# Patient Record
Sex: Female | Born: 1980
Health system: Southern US, Community
[De-identification: ages and names within clinical notes are randomized; demographics above are authoritative.]

## PROBLEM LIST (undated history)

## (undated) DIAGNOSIS — J329 Chronic sinusitis, unspecified: Secondary | ICD-10-CM

## (undated) DIAGNOSIS — J342 Deviated nasal septum: Secondary | ICD-10-CM

## (undated) DIAGNOSIS — R05 Cough: Secondary | ICD-10-CM

## (undated) DIAGNOSIS — O24419 Gestational diabetes mellitus in pregnancy, unspecified control: Secondary | ICD-10-CM

## (undated) DIAGNOSIS — J3489 Other specified disorders of nose and nasal sinuses: Secondary | ICD-10-CM

## (undated) DIAGNOSIS — Z8619 Personal history of other infectious and parasitic diseases: Secondary | ICD-10-CM

## (undated) DIAGNOSIS — J45909 Unspecified asthma, uncomplicated: Secondary | ICD-10-CM

## (undated) DIAGNOSIS — T7840XA Allergy, unspecified, initial encounter: Secondary | ICD-10-CM

## (undated) DIAGNOSIS — Q270 Congenital absence and hypoplasia of umbilical artery: Secondary | ICD-10-CM

## (undated) HISTORY — PX: ARTHROSCOPIC REPAIR ACL: SUR80

## (undated) HISTORY — DX: Personal history of other infectious and parasitic diseases: Z86.19

## (undated) HISTORY — DX: Gestational diabetes mellitus in pregnancy, unspecified control: O24.419

## (undated) HISTORY — DX: Congenital absence and hypoplasia of umbilical artery: Q27.0

## (undated) HISTORY — PX: KNEE ARTHROSCOPY: SUR90

## (undated) HISTORY — DX: Unspecified asthma, uncomplicated: J45.909

## (undated) HISTORY — PX: WISDOM TOOTH EXTRACTION: SHX21

## (undated) HISTORY — DX: Allergy, unspecified, initial encounter: T78.40XA

---

## 2006-03-09 ENCOUNTER — Ambulatory Visit (HOSPITAL_BASED_OUTPATIENT_CLINIC_OR_DEPARTMENT_OTHER): Admission: RE | Admit: 2006-03-09 | Discharge: 2006-03-09 | Payer: Self-pay | Admitting: Orthopedic Surgery

## 2011-04-26 ENCOUNTER — Encounter: Payer: Self-pay | Admitting: *Deleted

## 2011-04-26 ENCOUNTER — Emergency Department (HOSPITAL_COMMUNITY)
Admission: EM | Admit: 2011-04-26 | Discharge: 2011-04-26 | Disposition: A | Discharge status: Home / Self Care | Payer: 59 | Source: Home / Self Care | Attending: Emergency Medicine | Admitting: Emergency Medicine

## 2011-04-26 DIAGNOSIS — R05 Cough: Secondary | ICD-10-CM

## 2011-04-26 DIAGNOSIS — R51 Headache: Secondary | ICD-10-CM

## 2011-04-26 MED ORDER — GUAIFENESIN-CODEINE 100-10 MG/5ML PO SYRP: 5.0000 mL | ORAL_SOLUTION | Freq: Three times a day (TID) | ORAL | Status: AC | PRN

## 2011-04-26 NOTE — ED Provider Notes (Addendum)
History     CSN: [id number] Arrival date & time: 04/26/2011  6:24 PM   First MD Initiated Contact with Patient 04/26/11 1716      Chief Complaint  Patient presents with  . Cough    (Consider location/radiation/quality/duration/timing/severity/associated sxs/prior treatment) HPI Comments: COUGHING FOREVER AND SINUS TYPE HEADACHE TOOK BENADRYL, HA GONE NOW" SINCE Saturday THOUGHT HAD A SINUS INFECTION OR SOMETHING" MILD STUFFY NOSE. nO FEVERS. NO FEVERS No body aches  Patient is a 30 y.o. female presenting with cough. The history is provided by the patient. No language interpreter was used.  Cough This is a new problem. The problem occurs constantly. The cough is non-productive. Pertinent negatives include no rhinorrhea.    History reviewed. No pertinent past medical history.  Past Surgical History  Procedure Date  . Knee surgery     Family History  Problem Relation Age of Onset  . Hypertension Mother   . Diabetes Mother     History  Substance Use Topics  . Smoking status: Never Smoker   . Smokeless tobacco: Not on file  . Alcohol Use: Yes     OCCasonal    OB History    Grav Para Term Preterm Abortions TAB SAB Ect Mult Living                  Review of Systems  Constitutional: Negative for fever and appetite change.  HENT: Positive for congestion. Negative for rhinorrhea.   Respiratory: Positive for cough.     Allergies  Keflex; Penicillins; and Vancomycin  Home Medications   Current Outpatient Rx  Name Route Sig Dispense Refill  . ALBUTEROL SULFATE HFA 108 (90 BASE) MCG/ACT IN AERS Inhalation Inhale 2 puffs into the lungs every 6 (six) hours as needed.      Marland Kitchen DIPHENHYDRAMINE HCL 25 MG PO CAPS Oral Take 25 mg by mouth every 6 (six) hours as needed.      Marland Kitchen LORATADINE 10 MG PO TABS Oral Take 10 mg by mouth daily.      . MOMETASONE FUROATE 50 MCG/ACT NA SUSP Nasal Place 2 sprays into the nose daily.      . GUAIFENESIN-CODEINE 100-10 MG/5ML PO SYRP Oral  Take 5 mLs by mouth 3 (three) times daily as needed for cough. 120 mL 0    BP 123/68  Pulse 66  Temp(Src) 98.6 F (37 C) (Oral)  Resp 18  SpO2 100%  LMP 04/13/2011  Physical Exam  Nursing note and vitals reviewed. Constitutional: She appears well-developed and well-nourished. No distress.  HENT:  Head: Normocephalic.  Right Ear: Tympanic membrane normal.  Left Ear: Tympanic membrane normal.  Nose: Nose normal.  Mouth/Throat: Uvula is midline and oropharynx is clear and moist. No oropharyngeal exudate or tonsillar abscesses.    Eyes: Conjunctivae are normal.  Neck: Normal range of motion.  Cardiovascular: Normal rate.   Pulmonary/Chest: Effort normal and breath sounds normal. No respiratory distress. She has no decreased breath sounds. She has no wheezes. She has no rhonchi. She has no rales.  Abdominal: Soft. There is no tenderness. There is no rebound and no guarding.  Musculoskeletal: Normal range of motion.  Neurological: She is alert.  Skin: Skin is warm. She is not diaphoretic. No erythema.    ED Course  Procedures (including critical care time)  Labs Reviewed - No data to display No results found.   1. Headache   2. Cough       MDM  HA-AND COUGH (AFEBRILE NO CONGESTION AFEBRILE  NORMAL EXAM)        Jimmie Molly, MD 04/26/11 1905  Jimmie Molly, MD 04/26/11 [phone]

## 2011-04-26 NOTE — ED Notes (Signed)
Pt  Reports  Symptoms  Of  Cough /  Congested    Sinus  Drainage  Stuffy nose     Symptoms  For  Several  Days       Also  Reports  Headache  As well

## 2011-06-29 ENCOUNTER — Encounter: Payer: Self-pay | Admitting: Internal Medicine

## 2011-06-29 ENCOUNTER — Ambulatory Visit (INDEPENDENT_AMBULATORY_CARE_PROVIDER_SITE_OTHER): Payer: 59 | Admitting: Internal Medicine

## 2011-06-29 DIAGNOSIS — J309 Allergic rhinitis, unspecified: Secondary | ICD-10-CM | POA: Insufficient documentation

## 2011-06-29 DIAGNOSIS — Z0000 Encounter for general adult medical examination without abnormal findings: Secondary | ICD-10-CM | POA: Insufficient documentation

## 2011-06-29 MED ORDER — MONTELUKAST SODIUM 10 MG PO TABS: 10.0000 mg | ORAL_TABLET | Freq: Every day | ORAL | Status: DC

## 2011-06-29 MED ORDER — TRIAMCINOLONE ACETONIDE(NASAL) 55 MCG/ACT NA INHA: 2.0000 | Freq: Every day | NASAL | Status: DC

## 2011-06-29 MED ORDER — MOMETASONE FUROATE 220 MCG/INH IN AEPB: 2.0000 | INHALATION_SPRAY | Freq: Every day | RESPIRATORY_TRACT | Status: DC

## 2011-06-29 NOTE — Assessment & Plan Note (Signed)
Symptoms well controlled on current medications. Will continue. Followup in one year or as needed.

## 2011-06-29 NOTE — Progress Notes (Signed)
Subjective:    Patient ID: Michelle Alvarez, female    DOB: March 06, 1981, 31 y.o.   MRN: [id number]  HPI 31 year old female with history of allergic rhinitis presents to establish care and for general exam. She reports she is doing well. She has no concerns today. She reports her symptoms of runny nose and sneezing are well controlled on her current medications. She follows a healthy diet. She is active and runs several times per week. She is up-to-date on health maintenance including vaccinations and Pap smear. She goes to an OB/GYN for her Paps. She denies any new concerns or complaints today.  Outpatient Encounter Prescriptions as of 06/29/2011  Medication Sig Dispense Refill  . albuterol (PROVENTIL HFA;VENTOLIN HFA) 108 (90 BASE) MCG/ACT inhaler Inhale 2 puffs into the lungs every 6 (six) hours as needed.        . loratadine (CLARITIN) 10 MG tablet Take 10 mg by mouth daily as needed.       . mometasone (ASMANEX) 220 MCG/INH inhaler Inhale 2 puffs into the lungs daily.  3 Inhaler  1  . montelukast (SINGULAIR) 10 MG tablet Take 1 tablet (10 mg total) by mouth at bedtime.  90 tablet  1  . Multiple Vitamins-Minerals (MULTIVITAMIN WITH MINERALS) tablet Take 1 tablet by mouth daily.      Marland Kitchen triamcinolone (NASACORT AQ) 55 MCG/ACT nasal inhaler Place 2 sprays into the nose daily.  3 Inhaler  2     Review of Systems  Constitutional: Negative for fever, chills, appetite change, fatigue and unexpected weight change.  HENT: Negative for ear pain, congestion, sore throat, trouble swallowing, neck pain, voice change and sinus pressure.   Eyes: Negative for visual disturbance.  Respiratory: Negative for cough, shortness of breath, wheezing and stridor.   Cardiovascular: Negative for chest pain, palpitations and leg swelling.  Gastrointestinal: Negative for nausea, vomiting, abdominal pain, diarrhea, constipation, blood in stool, abdominal distention and anal bleeding.  Genitourinary: Negative for dysuria  and flank pain.  Musculoskeletal: Negative for myalgias, arthralgias and gait problem.  Skin: Negative for color change and rash.  Neurological: Negative for dizziness and headaches.  Hematological: Negative for adenopathy. Does not bruise/bleed easily.  Psychiatric/Behavioral: Negative for suicidal ideas, sleep disturbance and dysphoric mood. The patient is not nervous/anxious.    BP 118/68  Pulse 87  Temp(Src) 98 F (36.7 C) (Oral)  Ht 5' 6.5" (1.689 m)  Wt 132 lb (59.875 kg)  BMI 20.99 kg/m2  SpO2 100%  LMP 06/09/2011     Objective:   Physical Exam  Constitutional: She is oriented to person, place, and time. She appears well-developed and well-nourished. No distress.  HENT:  Head: Normocephalic and atraumatic.  Right Ear: External ear normal.  Left Ear: External ear normal.  Nose: Nose normal.  Mouth/Throat: Oropharynx is clear and moist. No oropharyngeal exudate.  Eyes: Conjunctivae are normal. Pupils are equal, round, and reactive to light. Right eye exhibits no discharge. Left eye exhibits no discharge. No scleral icterus.  Neck: Normal range of motion. Neck supple. No tracheal deviation present. No thyromegaly present.  Cardiovascular: Normal rate, regular rhythm, normal heart sounds and intact distal pulses.  Exam reveals no gallop and no friction rub.   No murmur heard. Pulmonary/Chest: Effort normal and breath sounds normal. No respiratory distress. She has no wheezes. She has no rales. She exhibits no tenderness.  Abdominal: Soft. Bowel sounds are normal. She exhibits no distension and no mass. There is no tenderness. There is no rebound and no guarding.  Musculoskeletal: Normal range of motion. She exhibits no edema and no tenderness.  Lymphadenopathy:    She has no cervical adenopathy.  Neurological: She is alert and oriented to person, place, and time. No cranial nerve deficit. She exhibits normal muscle tone. Coordination normal.  Skin: Skin is warm and dry. No  rash noted. She is not diaphoretic. No erythema. No pallor.  Psychiatric: She has a normal mood and affect. Her behavior is normal. Judgment and thought content normal.          Assessment & Plan:

## 2011-06-29 NOTE — Assessment & Plan Note (Signed)
Patient is up-to-date on health maintenance including vaccinations. Exam is normal today. She follows with OB/GYN for her Pap smears. She had labs including lipids through employee health at [facility]. Will obtain her previous records. Encourage continued efforts at healthy diet and exercise. Followup in one year or as needed.

## 2011-08-13 ENCOUNTER — Encounter: Payer: Self-pay | Admitting: Internal Medicine

## 2011-11-16 ENCOUNTER — Telehealth: Payer: Self-pay | Admitting: Internal Medicine

## 2011-11-16 MED ORDER — AZITHROMYCIN 500 MG PO TABS: ORAL_TABLET | ORAL | Status: DC

## 2011-11-16 NOTE — Telephone Encounter (Signed)
Caller: Kjerstin/Patient; PCP: Ronna Polio; CB#: [phone]; Call regarding Congestion; sx started 1st or 2nd week of June; sx include sinus pressure and nasal congestion; no fever; Triaged per URI Guideline; See in 24 hr d/t sx do not improve after 14 days of home care; attempted to make an appt but all appt full for today; OFFICE PLEASE REVIEW AND CALL PT BACK CONCERNING AN APPT

## 2011-11-16 NOTE — Telephone Encounter (Signed)
Patient advised as instructed via telephone, Rx sent to pharmacy, appt scheduled for Friday with Dr. Dan Humphreys.

## 2011-11-16 NOTE — Telephone Encounter (Signed)
Lets add her at 9am on Friday. We can call in Azithromycin 500mg  po today, then 250mg  po days 2-5. She should use sudafed and ibuprofen prn congestion and pain.

## 2011-11-18 ENCOUNTER — Encounter: Payer: Self-pay | Admitting: Internal Medicine

## 2011-11-18 ENCOUNTER — Ambulatory Visit (INDEPENDENT_AMBULATORY_CARE_PROVIDER_SITE_OTHER): Payer: 59 | Admitting: Internal Medicine

## 2011-11-18 VITALS — BP 122/68 | HR 60 | Temp 98.3°F | Resp 18 | Wt 129.5 lb

## 2011-11-18 DIAGNOSIS — B351 Tinea unguium: Secondary | ICD-10-CM

## 2011-11-18 DIAGNOSIS — J019 Acute sinusitis, unspecified: Secondary | ICD-10-CM | POA: Insufficient documentation

## 2011-11-18 NOTE — Assessment & Plan Note (Signed)
Symptoms consistent with acute sinusitis. Started azithromycin (called in by our office Wed because of Holiday) and has had some improvement in symptoms. Will continue to monitor. If no improvement over weekend, would favor changing to Levaquin and adding prednisone. Pt will email with update over the weekend.

## 2011-11-18 NOTE — Assessment & Plan Note (Signed)
Symptoms consistent with onychomycosis of great toenail (based on pictures, toenail covered with polish today). No improvement with topical treatments. Discussed treatment with lamisil and need to check LFTs prior to start and q6week during therapy. Also discussed need for contraception during treatment. Will wait until acute sinus infection improved prior to checking CMP and starting Lamisil.

## 2011-11-18 NOTE — Progress Notes (Signed)
Subjective:    Patient ID: Michelle Alvarez, female    DOB: 1981-03-29, 31 y.o.   MRN: [id number]  HPI 31YO female presents for acute visit complaining of 4 week h/o nasal congestion and 1 week history of headache, sinus pressure, jaw pain and purulent nasal drainage. She brings picture of sinus drainage which is dark green in color.  She denies fever, chills, cough, ear pain. She called our office last week, and because of the holiday, was started on Azithromycin prior to being seen today. Since starting the medication, she notes improvement in sinus pressure and headache.  Her drainage is now clear.  She is also concerned about several month h/o great toenail thickening and cracking. Toenail currently has polish on it, but she brings picture which shows thickening.  She has tried topical listerine with no improvement. She is interested in trying oral medication.  Outpatient Encounter Prescriptions as of 11/18/2011  Medication Sig Dispense Refill  . albuterol (PROVENTIL HFA;VENTOLIN HFA) 108 (90 BASE) MCG/ACT inhaler Inhale 2 puffs into the lungs every 6 (six) hours as needed.        Marland Kitchen azithromycin (ZITHROMAX) 500 MG tablet Take one tablet by mouth today, then one half tablet by mouth days 2-5  3 tablet  0  . loratadine (CLARITIN) 10 MG tablet Take 10 mg by mouth daily as needed.       . mometasone (ASMANEX) 220 MCG/INH inhaler Inhale 2 puffs into the lungs daily.  3 Inhaler  1  . montelukast (SINGULAIR) 10 MG tablet Take 1 tablet (10 mg total) by mouth at bedtime.  90 tablet  1  . Multiple Vitamins-Minerals (MULTIVITAMIN WITH MINERALS) tablet Take 1 tablet by mouth daily.      Marland Kitchen triamcinolone (NASACORT AQ) 55 MCG/ACT nasal inhaler Place 2 sprays into the nose daily.  3 Inhaler  2   BP 122/68  Pulse 60  Temp 98.3 F (36.8 C) (Oral)  Resp 18  Wt 129 lb 8 oz (58.741 kg)  SpO2 97%  LMP 10/18/2011  Review of Systems  Constitutional: Negative for fever, chills and unexpected weight change.    HENT: Positive for congestion and sinus pressure. Negative for hearing loss, ear pain, nosebleeds, sore throat, facial swelling, rhinorrhea, sneezing, mouth sores, trouble swallowing, neck pain, neck stiffness, voice change, postnasal drip, tinnitus and ear discharge.   Eyes: Negative for pain, discharge, redness and visual disturbance.  Respiratory: Negative for cough, chest tightness, shortness of breath, wheezing and stridor.   Cardiovascular: Negative for chest pain, palpitations and leg swelling.  Musculoskeletal: Negative for myalgias and arthralgias.  Skin: Negative for color change and rash.  Neurological: Positive for headaches. Negative for dizziness, weakness and light-headedness.  Hematological: Negative for adenopathy.       Objective:   Physical Exam  Constitutional: She is oriented to person, place, and time. She appears well-developed and well-nourished. No distress.  HENT:  Head: Normocephalic and atraumatic.  Right Ear: External ear normal.  Left Ear: External ear normal.  Nose: Nose normal.  Mouth/Throat: Oropharynx is clear and moist. No oropharyngeal exudate.  Eyes: Conjunctivae are normal. Pupils are equal, round, and reactive to light. Right eye exhibits no discharge. Left eye exhibits no discharge. No scleral icterus.  Neck: Normal range of motion. Neck supple. No tracheal deviation present. No thyromegaly present.  Cardiovascular: Normal rate, regular rhythm, normal heart sounds and intact distal pulses.  Exam reveals no gallop and no friction rub.   No murmur heard. Pulmonary/Chest: Effort normal and  breath sounds normal. No respiratory distress. She has no wheezes. She has no rales. She exhibits no tenderness.  Musculoskeletal: Normal range of motion. She exhibits no edema and no tenderness.  Lymphadenopathy:    She has no cervical adenopathy.  Neurological: She is alert and oriented to person, place, and time. No cranial nerve deficit. She exhibits normal  muscle tone. Coordination normal.  Skin: Skin is warm and dry. No rash noted. She is not diaphoretic. No erythema. No pallor.  Psychiatric: She has a normal mood and affect. Her behavior is normal. Judgment and thought content normal.          Assessment & Plan:

## 2012-01-06 ENCOUNTER — Ambulatory Visit (INDEPENDENT_AMBULATORY_CARE_PROVIDER_SITE_OTHER): Payer: 59 | Admitting: Internal Medicine

## 2012-01-06 ENCOUNTER — Encounter: Payer: Self-pay | Admitting: Internal Medicine

## 2012-01-06 VITALS — BP 110/70 | HR 67 | Temp 98.4°F | Ht 66.5 in | Wt 131.0 lb

## 2012-01-06 DIAGNOSIS — J019 Acute sinusitis, unspecified: Secondary | ICD-10-CM

## 2012-01-06 MED ORDER — PREDNISONE (PAK) 10 MG PO TABS: ORAL_TABLET | ORAL | Status: AC

## 2012-01-06 MED ORDER — LEVOFLOXACIN 750 MG PO TABS: 750.0000 mg | ORAL_TABLET | Freq: Every day | ORAL | Status: AC

## 2012-01-06 NOTE — Progress Notes (Signed)
Subjective:    Patient ID: Michelle Alvarez, female    DOB: 1980-12-29, 31 y.o.   MRN: [id number]  HPI 32 year old female with recent history of sinusitis presents for followup. In July, she was treated for sinusitis with azithromycin. She reports significant improvement in her symptoms however symptoms began to recur approximately one week ago. She reports nasal congestion, purulent nasal drainage, sinus pressure and headache. She denies fever, chills, chest pain, shortness of breath. She has been using over-the-counter allergy medications and ibuprofen with no improvement.  Outpatient Encounter Prescriptions as of 01/06/2012  Medication Sig Dispense Refill  . albuterol (PROVENTIL HFA;VENTOLIN HFA) 108 (90 BASE) MCG/ACT inhaler Inhale 2 puffs into the lungs every 6 (six) hours as needed.        . loratadine (CLARITIN) 10 MG tablet Take 10 mg by mouth daily as needed.       . mometasone (ASMANEX) 220 MCG/INH inhaler Inhale 2 puffs into the lungs daily.  3 Inhaler  1  . montelukast (SINGULAIR) 10 MG tablet Take 1 tablet (10 mg total) by mouth at bedtime.  90 tablet  1  . Multiple Vitamins-Minerals (MULTIVITAMIN WITH MINERALS) tablet Take 1 tablet by mouth daily.      Marland Kitchen triamcinolone (NASACORT AQ) 55 MCG/ACT nasal inhaler Place 2 sprays into the nose daily.  3 Inhaler  2  . levofloxacin (LEVAQUIN) 750 MG tablet Take 1 tablet (750 mg total) by mouth daily.  14 tablet  0  . predniSONE (STERAPRED UNI-PAK) 10 MG tablet Take 60mg  day 1 then taper by 10mg  daily  21 tablet  0  . DISCONTD: azithromycin (ZITHROMAX) 500 MG tablet Take one tablet by mouth today, then one half tablet by mouth days 2-5  3 tablet  0   BP 110/70  Pulse 67  Temp 98.4 F (36.9 C) (Oral)  Ht 5' 6.5" (1.689 m)  Wt 131 lb (59.421 kg)  BMI 20.83 kg/m2  SpO2 97%  LMP 12/23/2011  Review of Systems  Constitutional: Negative for fever, chills and unexpected weight change.  HENT: Positive for congestion and sinus pressure. Negative  for hearing loss, ear pain, nosebleeds, sore throat, facial swelling, rhinorrhea, sneezing, mouth sores, trouble swallowing, neck pain, neck stiffness, voice change, postnasal drip, tinnitus and ear discharge.   Eyes: Negative for pain, discharge, redness and visual disturbance.  Respiratory: Negative for cough, chest tightness, shortness of breath, wheezing and stridor.   Cardiovascular: Negative for chest pain, palpitations and leg swelling.  Musculoskeletal: Negative for myalgias and arthralgias.  Skin: Negative for color change and rash.  Neurological: Positive for headaches. Negative for dizziness, weakness and light-headedness.  Hematological: Negative for adenopathy.       Objective:   Physical Exam  Constitutional: She is oriented to person, place, and time. She appears well-developed and well-nourished. No distress.  HENT:  Head: Normocephalic and atraumatic.  Right Ear: External ear normal.  Left Ear: External ear normal.  Nose: Nose normal.  Mouth/Throat: Oropharynx is clear and moist. No oropharyngeal exudate.  Eyes: Conjunctivae are normal. Pupils are equal, round, and reactive to light. Right eye exhibits no discharge. Left eye exhibits no discharge. No scleral icterus.  Neck: Normal range of motion. Neck supple. No tracheal deviation present. No thyromegaly present.  Cardiovascular: Normal rate, regular rhythm, normal heart sounds and intact distal pulses.  Exam reveals no gallop and no friction rub.   No murmur heard. Pulmonary/Chest: Effort normal and breath sounds normal. No respiratory distress. She has no wheezes. She has no  rales. She exhibits no tenderness.  Musculoskeletal: Normal range of motion. She exhibits no edema and no tenderness.  Lymphadenopathy:    She has no cervical adenopathy.  Neurological: She is alert and oriented to person, place, and time. No cranial nerve deficit. She exhibits normal muscle tone. Coordination normal.  Skin: Skin is warm and dry.  No rash noted. She is not diaphoretic. No erythema. No pallor.  Psychiatric: She has a normal mood and affect. Her behavior is normal. Judgment and thought content normal.          Assessment & Plan:

## 2012-01-06 NOTE — Assessment & Plan Note (Signed)
Symptoms improved and then recurred after treatment with azithromycin. Will treat with Levaquin. If symptoms do not improve over the next 7 days, patient will add prednisone. If no improvement with prednisone and Levaquin, would favor ENT evaluation. Followup one month.

## 2012-03-02 ENCOUNTER — Other Ambulatory Visit: Payer: Self-pay | Admitting: *Deleted

## 2012-03-02 ENCOUNTER — Encounter: Payer: Self-pay | Admitting: Internal Medicine

## 2012-03-02 MED ORDER — MONTELUKAST SODIUM 10 MG PO TABS: 10.0000 mg | ORAL_TABLET | Freq: Every day | ORAL | Status: DC

## 2012-03-02 MED ORDER — ALBUTEROL SULFATE HFA 108 (90 BASE) MCG/ACT IN AERS: 2.0000 | INHALATION_SPRAY | Freq: Four times a day (QID) | RESPIRATORY_TRACT | Status: DC | PRN

## 2012-03-02 NOTE — Telephone Encounter (Signed)
Opened in error

## 2012-03-06 ENCOUNTER — Encounter: Payer: Self-pay | Admitting: Internal Medicine

## 2012-04-15 DIAGNOSIS — J342 Deviated nasal septum: Secondary | ICD-10-CM

## 2012-04-15 DIAGNOSIS — J329 Chronic sinusitis, unspecified: Secondary | ICD-10-CM

## 2012-04-15 HISTORY — DX: Deviated nasal septum: J34.2

## 2012-04-15 HISTORY — DX: Chronic sinusitis, unspecified: J32.9

## 2012-04-19 ENCOUNTER — Other Ambulatory Visit (HOSPITAL_COMMUNITY): Payer: Self-pay | Admitting: Otolaryngology

## 2012-04-19 DIAGNOSIS — J019 Acute sinusitis, unspecified: Secondary | ICD-10-CM

## 2012-04-20 ENCOUNTER — Ambulatory Visit (HOSPITAL_COMMUNITY)
Admission: RE | Admit: 2012-04-20 | Discharge: 2012-04-20 | Disposition: A | Discharge status: Home / Self Care | Payer: 59 | Source: Ambulatory Visit | Attending: Otolaryngology | Admitting: Otolaryngology

## 2012-04-20 DIAGNOSIS — J019 Acute sinusitis, unspecified: Secondary | ICD-10-CM

## 2012-05-02 ENCOUNTER — Other Ambulatory Visit (HOSPITAL_COMMUNITY): Payer: Self-pay | Admitting: Otolaryngology

## 2012-05-02 DIAGNOSIS — J329 Chronic sinusitis, unspecified: Secondary | ICD-10-CM

## 2012-05-03 ENCOUNTER — Encounter: Payer: Self-pay | Admitting: Internal Medicine

## 2012-05-04 MED ORDER — MOMETASONE FUROATE 220 MCG/INH IN AEPB: 2.0000 | INHALATION_SPRAY | Freq: Every day | RESPIRATORY_TRACT | Status: DC

## 2012-05-14 ENCOUNTER — Encounter (HOSPITAL_BASED_OUTPATIENT_CLINIC_OR_DEPARTMENT_OTHER): Payer: Self-pay | Admitting: *Deleted

## 2012-05-14 DIAGNOSIS — J3489 Other specified disorders of nose and nasal sinuses: Secondary | ICD-10-CM

## 2012-05-14 DIAGNOSIS — R059 Cough, unspecified: Secondary | ICD-10-CM

## 2012-05-14 HISTORY — DX: Cough, unspecified: R05.9

## 2012-05-14 HISTORY — DX: Other specified disorders of nose and nasal sinuses: J34.89

## 2012-05-15 ENCOUNTER — Ambulatory Visit (HOSPITAL_COMMUNITY)
Admission: RE | Admit: 2012-05-15 | Discharge: 2012-05-15 | Disposition: A | Discharge status: Home / Self Care | Payer: 59 | Source: Ambulatory Visit | Attending: Otolaryngology | Admitting: Otolaryngology

## 2012-05-15 DIAGNOSIS — Z01818 Encounter for other preprocedural examination: Secondary | ICD-10-CM | POA: Insufficient documentation

## 2012-05-15 DIAGNOSIS — J329 Chronic sinusitis, unspecified: Secondary | ICD-10-CM | POA: Insufficient documentation

## 2012-05-21 ENCOUNTER — Encounter (HOSPITAL_BASED_OUTPATIENT_CLINIC_OR_DEPARTMENT_OTHER): Payer: Self-pay | Admitting: *Deleted

## 2012-05-21 ENCOUNTER — Encounter (HOSPITAL_BASED_OUTPATIENT_CLINIC_OR_DEPARTMENT_OTHER): Payer: Self-pay | Admitting: Anesthesiology

## 2012-05-21 ENCOUNTER — Ambulatory Visit (HOSPITAL_BASED_OUTPATIENT_CLINIC_OR_DEPARTMENT_OTHER)
Admission: RE | Admit: 2012-05-21 | Discharge: 2012-05-21 | Disposition: A | Discharge status: Home / Self Care | Payer: 59 | Source: Ambulatory Visit | Attending: Otolaryngology | Admitting: Otolaryngology

## 2012-05-21 ENCOUNTER — Encounter (HOSPITAL_BASED_OUTPATIENT_CLINIC_OR_DEPARTMENT_OTHER)
Admission: RE | Disposition: A | Discharge status: Home / Self Care | Payer: Self-pay | Source: Ambulatory Visit | Attending: Otolaryngology

## 2012-05-21 ENCOUNTER — Ambulatory Visit (HOSPITAL_BASED_OUTPATIENT_CLINIC_OR_DEPARTMENT_OTHER): Payer: 59 | Admitting: Anesthesiology

## 2012-05-21 DIAGNOSIS — J338 Other polyp of sinus: Secondary | ICD-10-CM | POA: Insufficient documentation

## 2012-05-21 DIAGNOSIS — J343 Hypertrophy of nasal turbinates: Secondary | ICD-10-CM | POA: Insufficient documentation

## 2012-05-21 DIAGNOSIS — J342 Deviated nasal septum: Secondary | ICD-10-CM | POA: Diagnosis present

## 2012-05-21 DIAGNOSIS — J329 Chronic sinusitis, unspecified: Secondary | ICD-10-CM | POA: Diagnosis present

## 2012-05-21 DIAGNOSIS — J328 Other chronic sinusitis: Secondary | ICD-10-CM | POA: Insufficient documentation

## 2012-05-21 HISTORY — PX: NASAL SEPTOPLASTY W/ TURBINOPLASTY: SHX2070

## 2012-05-21 HISTORY — DX: Other specified disorders of nose and nasal sinuses: J34.89

## 2012-05-21 HISTORY — DX: Deviated nasal septum: J34.2

## 2012-05-21 HISTORY — DX: Cough: R05

## 2012-05-21 HISTORY — DX: Chronic sinusitis, unspecified: J32.9

## 2012-05-21 HISTORY — PX: SINUS ENDO W/FUSION: SHX777

## 2012-05-21 SURGERY — SINUS SURGERY, ENDOSCOPIC, USING COMPUTER-ASSISTED NAVIGATION
Anesthesia: General | Laterality: Bilateral | Wound class: Clean Contaminated

## 2012-05-21 MED ORDER — ONDANSETRON HCL 4 MG/2ML IJ SOLN: 4.0000 mg | Freq: Four times a day (QID) | INTRAMUSCULAR | Status: DC | PRN

## 2012-05-21 MED ORDER — OXYCODONE HCL 5 MG PO TABS: 5.0000 mg | ORAL_TABLET | Freq: Once | ORAL | Status: DC | PRN

## 2012-05-21 MED ORDER — HYDROMORPHONE HCL PF 1 MG/ML IJ SOLN: 0.2500 mg | INTRAMUSCULAR | Status: DC | PRN

## 2012-05-21 MED ORDER — LACTATED RINGERS IV SOLN
INTRAVENOUS | Status: DC
  Administered 2012-05-21 (×2): via INTRAVENOUS

## 2012-05-21 MED ORDER — MUPIROCIN 2 % EX OINT
TOPICAL_OINTMENT | CUTANEOUS | Status: DC | PRN
  Administered 2012-05-21: 1 via NASAL

## 2012-05-21 MED ORDER — LIDOCAINE-EPINEPHRINE 1 %-1:100000 IJ SOLN
INTRAMUSCULAR | Status: DC | PRN
  Administered 2012-05-21: 9 mL

## 2012-05-21 MED ORDER — MIDAZOLAM HCL 5 MG/5ML IJ SOLN
INTRAMUSCULAR | Status: DC | PRN
  Administered 2012-05-21: 2 mg via INTRAVENOUS

## 2012-05-21 MED ORDER — ROCURONIUM BROMIDE 100 MG/10ML IV SOLN
INTRAVENOUS | Status: DC | PRN
  Administered 2012-05-21: 30 mg via INTRAVENOUS

## 2012-05-21 MED ORDER — LIDOCAINE HCL (CARDIAC) 20 MG/ML IV SOLN
INTRAVENOUS | Status: DC | PRN
  Administered 2012-05-21: 60 mg via INTRAVENOUS

## 2012-05-21 MED ORDER — OXYMETAZOLINE HCL 0.05 % NA SOLN
NASAL | Status: DC | PRN
  Administered 2012-05-21: 1 via NASAL

## 2012-05-21 MED ORDER — OXYCODONE HCL 5 MG/5ML PO SOLN: 5.0000 mg | Freq: Once | ORAL | Status: DC | PRN

## 2012-05-21 MED ORDER — LEVOFLOXACIN 500 MG PO TABS: 500.0000 mg | ORAL_TABLET | Freq: Every day | ORAL | Status: DC

## 2012-05-21 MED ORDER — DEXAMETHASONE SODIUM PHOSPHATE 4 MG/ML IJ SOLN
INTRAMUSCULAR | Status: DC | PRN
  Administered 2012-05-21: 10 mg via INTRAVENOUS

## 2012-05-21 MED ORDER — PROPOFOL 10 MG/ML IV BOLUS
INTRAVENOUS | Status: DC | PRN
  Administered 2012-05-21: 160 mg via INTRAVENOUS

## 2012-05-21 MED ORDER — NEOSTIGMINE METHYLSULFATE 1 MG/ML IJ SOLN
INTRAMUSCULAR | Status: DC | PRN
  Administered 2012-05-21: 3 mg via INTRAVENOUS

## 2012-05-21 MED ORDER — GLYCOPYRROLATE 0.2 MG/ML IJ SOLN
INTRAMUSCULAR | Status: DC | PRN
  Administered 2012-05-21: 0.2 mg via INTRAVENOUS
  Administered 2012-05-21: 0.4 mg via INTRAVENOUS

## 2012-05-21 MED ORDER — HYDROCODONE-ACETAMINOPHEN 5-325 MG PO TABS: 1.0000 | ORAL_TABLET | Freq: Four times a day (QID) | ORAL | Status: DC | PRN

## 2012-05-21 MED ORDER — ONDANSETRON HCL 4 MG/2ML IJ SOLN
INTRAMUSCULAR | Status: DC | PRN
  Administered 2012-05-21: 4 mg via INTRAVENOUS

## 2012-05-21 MED ORDER — EPHEDRINE SULFATE 50 MG/ML IJ SOLN
INTRAMUSCULAR | Status: DC | PRN
  Administered 2012-05-21: 10 mg via INTRAVENOUS

## 2012-05-21 MED ORDER — CIPROFLOXACIN IN D5W 400 MG/200ML IV SOLN
INTRAVENOUS | Status: DC | PRN
  Administered 2012-05-21: 400 mg via INTRAVENOUS

## 2012-05-21 MED ORDER — FENTANYL CITRATE 0.05 MG/ML IJ SOLN
INTRAMUSCULAR | Status: DC | PRN
  Administered 2012-05-21: 50 ug via INTRAVENOUS
  Administered 2012-05-21: 100 ug via INTRAVENOUS
  Administered 2012-05-21: 50 ug via INTRAVENOUS

## 2012-05-21 SURGICAL SUPPLY — 72 items
ATTRACTOMAT 16X20 MAGNETIC DRP (DRAPES) IMPLANT
BLADE RAD40 ROTATE 4M 4 5PK (BLADE) IMPLANT
BLADE RAD60 ROTATE M4 4 5PK (BLADE) ×2 IMPLANT
BLADE ROTATE RAD 12 4 M4 (BLADE) IMPLANT
BLADE ROTATE RAD 40 4 M4 (BLADE) ×2 IMPLANT
BLADE ROTATE RAD12 5PK M4 4MM (BLADE) IMPLANT
BLADE ROTATE TRICUT 4X13 M4 (BLADE) ×2 IMPLANT
BLADE SURG 15 STRL LF DISP TIS (BLADE) ×1 IMPLANT
BLADE SURG 15 STRL SS (BLADE) ×1
BLADE TRICUT ROTATE M4 4 5PK (BLADE) IMPLANT
BUR HS RAD FRONTAL 3 (BURR) IMPLANT
CANISTER SUC SOCK COL 7 IN (MISCELLANEOUS) ×2 IMPLANT
CANISTER SUCTION 1200CC (MISCELLANEOUS) ×4 IMPLANT
CATH SINUS BALLN 7X16 (CATHETERS) IMPLANT
CATH SINUS BALLN RELIEV 6X16 (SINUPLASTY) IMPLANT
CATH SINUS GUIDE F-70 (CATHETERS) IMPLANT
CATH SINUS GUIDE M/110 (CATHETERS) IMPLANT
CATH SINUS IRRIGATION 2.0 (CATHETERS) IMPLANT
CLOTH BEACON ORANGE TIMEOUT ST (SAFETY) ×2 IMPLANT
COAGULATOR SUCT SWTCH 10FR 6 (ELECTROSURGICAL) ×2 IMPLANT
DECANTER SPIKE VIAL GLASS SM (MISCELLANEOUS) ×2 IMPLANT
DEVICE INFLATION 20/61 (MISCELLANEOUS) IMPLANT
DRAPE SURG 17X23 STRL (DRAPES) ×2 IMPLANT
DRESSING NASAL KENNEDY 3.5X.9 (MISCELLANEOUS) IMPLANT
DRSG NASAL KENNEDY 3.5X.9 (MISCELLANEOUS)
DRSG NASOPORE 8CM (GAUZE/BANDAGES/DRESSINGS) IMPLANT
DRSG TELFA 3X8 NADH (GAUZE/BANDAGES/DRESSINGS) IMPLANT
ELECT COATED BLADE 2.86 ST (ELECTRODE) IMPLANT
ELECT REM PT RETURN 9FT ADLT (ELECTROSURGICAL) ×2
ELECTRODE REM PT RTRN 9FT ADLT (ELECTROSURGICAL) ×1 IMPLANT
GLOVE BIOGEL M 7.0 STRL (GLOVE) ×6 IMPLANT
GLOVE BIOGEL PI IND STRL 7.5 (GLOVE) ×1 IMPLANT
GLOVE BIOGEL PI INDICATOR 7.5 (GLOVE) ×1
GOWN PREVENTION PLUS XLARGE (GOWN DISPOSABLE) ×2 IMPLANT
GOWN PREVENTION PLUS XXLARGE (GOWN DISPOSABLE) ×2 IMPLANT
HANDLE SINUS GUIDE LP (INSTRUMENTS) IMPLANT
HANDPIECE HYDRODEBRIDER FRONT (BLADE) ×2 IMPLANT
IV NS 1000ML (IV SOLUTION)
IV NS 1000ML BAXH (IV SOLUTION) IMPLANT
IV NS 500ML (IV SOLUTION) ×1
IV NS 500ML BAXH (IV SOLUTION) ×1 IMPLANT
NEEDLE 27GAX1X1/2 (NEEDLE) ×2 IMPLANT
NS IRRIG 1000ML POUR BTL (IV SOLUTION) ×2 IMPLANT
PACK BASIN DAY SURGERY FS (CUSTOM PROCEDURE TRAY) ×2 IMPLANT
PACK ENT DAY SURGERY (CUSTOM PROCEDURE TRAY) ×2 IMPLANT
PAD ENT ADHESIVE 25PK (MISCELLANEOUS) ×2 IMPLANT
PENCIL BUTTON HOLSTER BLD 10FT (ELECTRODE) IMPLANT
SET EXT MALE ROTATING LL 32IN (MISCELLANEOUS) ×2 IMPLANT
SLEEVE SCD COMPRESS KNEE MED (MISCELLANEOUS) ×2 IMPLANT
SOLUTION BUTLER CLEAR DIP (MISCELLANEOUS) ×2 IMPLANT
SPLINT NASAL DOYLE BI-VL (GAUZE/BANDAGES/DRESSINGS) ×2 IMPLANT
SPONGE GAUZE 2X2 8PLY STRL LF (GAUZE/BANDAGES/DRESSINGS) ×2 IMPLANT
SPONGE NEURO XRAY DETECT 1X3 (DISPOSABLE) ×2 IMPLANT
SPONGE SURGIFOAM ABS GEL 12-7 (HEMOSTASIS) IMPLANT
SUT ETHILON 3 0 PS 1 (SUTURE) ×2 IMPLANT
SUT PLAIN 4 0 ~~LOC~~ 1 (SUTURE) ×2 IMPLANT
SUT SILK 3 0 PS 1 (SUTURE) IMPLANT
SWAB CULTURE LIQ STUART DBL (MISCELLANEOUS) ×2 IMPLANT
SYR 20CC LL (SYRINGE) ×2 IMPLANT
SYR 3ML 23GX1 SAFETY (SYRINGE) IMPLANT
SYSTEM HYDRODEBRIDER (MISCELLANEOUS) IMPLANT
SYSTEM RELIEVA LUMA ILLUM (SINUPLASTY) IMPLANT
TOWEL OR 17X24 6PK STRL BLUE (TOWEL DISPOSABLE) ×4 IMPLANT
TRACKER ENT INSTRUMENT (MISCELLANEOUS) ×2 IMPLANT
TRACKER ENT PATIENT (MISCELLANEOUS) ×2 IMPLANT
TUBE ANAEROBIC SPECIMEN COL (MISCELLANEOUS) ×2 IMPLANT
TUBE CONNECTING 20X1/4 (TUBING) ×2 IMPLANT
TUBE SALEM SUMP 12R W/ARV (TUBING) IMPLANT
TUBE SALEM SUMP 16 FR W/ARV (TUBING) ×2 IMPLANT
TUBING STRAIGHTSHOT EPS 5PK (TUBING) ×2 IMPLANT
WATER STERILE IRR 1000ML POUR (IV SOLUTION) IMPLANT
YANKAUER SUCT BULB TIP NO VENT (SUCTIONS) ×2 IMPLANT

## 2012-05-21 NOTE — Anesthesia Procedure Notes (Signed)
Procedure Name: Intubation Date/Time: 05/21/2012 8:33 AM Performed by: Zenia Resides D Pre-anesthesia Checklist: Patient identified, Emergency Drugs available, Suction available and Patient being monitored Patient Re-evaluated:Patient Re-evaluated prior to inductionOxygen Delivery Method: Circle System Utilized Preoxygenation: Pre-oxygenation with 100% oxygen Intubation Type: IV induction Ventilation: Mask ventilation without difficulty Laryngoscope Size: Mac and 3 Grade View: Grade I Tube type: Oral Tube size: 7.0 mm Number of attempts: 1 Airway Equipment and Method: stylet and oral airway Placement Confirmation: ETT inserted through vocal cords under direct vision,  positive ETCO2 and breath sounds checked- equal and bilateral Secured at: 22 cm Tube secured with: Tape Dental Injury: Teeth and Oropharynx as per pre-operative assessment

## 2012-05-21 NOTE — H&P (Signed)
Michelle Alvarez is an 32 y.o. female.   Chief Complaint: Chronic sinusitis and septal deviation HPI: Chronic sinusitis, tx'ed for infection over 6 months. CT scan shows chronic infection and obstruction.  Past Medical History  Diagnosis Date  . Deviated nasal septum 04/2012  . Chronic sinusitis 04/2012  . Cough 05/14/2012  . Stuffy and runny nose 05/14/2012    clear drainage from nose    Past Surgical History  Procedure Date  . Wisdom tooth extraction   . Arthroscopic repair acl     bilateral  . Knee arthroscopy     right    Family History  Problem Relation Age of Onset  . Diabetes Father   . Hyperlipidemia Father   . Hypertension Father   . Arthritis Maternal Grandmother   . Breast cancer Maternal Grandmother   . Cancer Maternal Grandmother     melanoma, breast  . Colon cancer Paternal Grandfather   . Cancer Paternal Grandfather     colon  . Arthritis Maternal Grandfather    Social History:  reports that she has never smoked. She has never used smokeless tobacco. She reports that she drinks alcohol. She reports that she does not use illicit drugs.  Allergies:  Allergies  Allergen Reactions  . Cephalexin Rash  . Doxycycline Rash  . Penicillins Rash  . Vancomycin Itching    Medications Prior to Admission  Medication Sig Dispense Refill  . albuterol (PROVENTIL HFA;VENTOLIN HFA) 108 (90 BASE) MCG/ACT inhaler Inhale 2 puffs into the lungs every 6 (six) hours as needed.  1 Inhaler  6  . mometasone (ASMANEX) 220 MCG/INH inhaler Inhale 2 puffs into the lungs daily.  3 Inhaler  1  . mometasone (NASONEX) 50 MCG/ACT nasal spray Place 2 sprays into the nose daily.      . montelukast (SINGULAIR) 10 MG tablet Take 1 tablet (10 mg total) by mouth at bedtime.  90 tablet  1  . predniSONE (STERAPRED UNI-PAK) 10 MG tablet Take 10 mg by mouth daily. PREDNISONE TAPER        No results found for this or any previous visit (from the past 48 hour(s)). No results found.  Review  of Systems  Constitutional: Negative.   Respiratory: Negative.   Cardiovascular: Negative.   Gastrointestinal: Negative.   Musculoskeletal: Negative.   Skin: Negative.   Neurological: Negative.     Blood pressure 130/82, pulse 67, temperature 97.9 F (36.6 C), temperature source Oral, resp. rate 16, height 5\' 6"  (1.676 m), weight 55.43 kg (122 lb 3.2 oz), last menstrual period 05/05/2012, SpO2 100.00%. Physical Exam  Constitutional: She is oriented to person, place, and time. She appears well-developed and well-nourished.  Neck: Normal range of motion. Neck supple.  Cardiovascular: Normal rate and regular rhythm.   Respiratory: Effort normal and breath sounds normal.  GI: Soft.  Musculoskeletal: Normal range of motion.  Neurological: She is alert and oriented to person, place, and time.     Assessment/Plan Adm for OP surgery, septo and bil ESS with Fusion.  Michelle Alvarez 05/21/2012, 8:14 AM

## 2012-05-21 NOTE — Transfer of Care (Signed)
Immediate Anesthesia Transfer of Care Note  Patient: Michelle Alvarez  Procedure(s) Performed: Procedure(s) (LRB) with comments: ENDOSCOPIC SINUS SURGERY WITH FUSION NAVIGATION (Bilateral) - BILATERAL ENDOSCOPIC SINUS SURGERY  WITH FUSION SCAN  NASAL SEPTOPLASTY WITH TURBINATE REDUCTION (Bilateral) - NASAL SEPTOPLATY AND BILATERAL INFERIOR TURBINATE REDUCTION   Patient Location: PACU  Anesthesia Type:General  Level of Consciousness: awake, alert  and oriented  Airway & Oxygen Therapy: Patient Spontanous Breathing and Patient connected to face mask oxygen  Post-op Assessment: Report given to PACU RN and Post -op Vital signs reviewed and stable  Post vital signs: Reviewed and stable  Complications: No apparent anesthesia complications

## 2012-05-21 NOTE — Anesthesia Postprocedure Evaluation (Signed)
Anesthesia Post Note  Patient: Michelle Alvarez  Procedure(s) Performed: Procedure(s) (LRB): ENDOSCOPIC SINUS SURGERY WITH FUSION NAVIGATION (Bilateral) NASAL SEPTOPLASTY WITH TURBINATE REDUCTION (Bilateral)  Anesthesia type: General  Patient location: PACU  Post pain: Pain level controlled and Adequate analgesia  Post assessment: Post-op Vital signs reviewed, Patient's Cardiovascular Status Stable, Respiratory Function Stable, Patent Airway and Pain level controlled  Last Vitals:  Filed Vitals:   05/21/12 1050  BP: 120/51  Pulse: 70  Temp: 37.2 C  Resp: 17    Post vital signs: Reviewed and stable  Level of consciousness: awake, alert  and oriented  Complications: No apparent anesthesia complications

## 2012-05-21 NOTE — Brief Op Note (Signed)
05/21/2012  10:43 AM  PATIENT:  Michelle Alvarez  32 y.o. female  PRE-OPERATIVE DIAGNOSIS:  DEVIATED NASAL SEPTUM; CHRONIC OTITIS MEDIA   POST-OPERATIVE DIAGNOSIS:  DEVIATED NASAL SEPTUM; CHRONIC OTITIS MEDIA   PROCEDURE:  Procedure(s) (LRB) with comments: ENDOSCOPIC SINUS SURGERY WITH FUSION NAVIGATION (Bilateral) - BILATERAL ENDOSCOPIC SINUS SURGERY  WITH FUSION SCAN  NASAL SEPTOPLASTY WITH TURBINATE REDUCTION (Bilateral) - NASAL SEPTOPLATY AND BILATERAL INFERIOR TURBINATE REDUCTION   SURGEON:  Surgeon(s) and Role:    * Osborn Coho, MD - Primary  PHYSICIAN ASSISTANT:   ASSISTANTS: none   ANESTHESIA:   general  EBL:  Total I/O In: 1000 [I.V.:1000] Out: - <200 cc  BLOOD ADMINISTERED:none  DRAINS: none   LOCAL MEDICATIONS USED:  LIDOCAINE  and Amount: 9 ml  SPECIMEN:  Source of Specimen:  sinus contents  DISPOSITION OF SPECIMEN:  PATHOLOGY  COUNTS:  YES  TOURNIQUET:  * No tourniquets in log *  DICTATION: .Other Dictation: Dictation Number [id number]  PLAN OF CARE: Discharge to home after PACU  PATIENT DISPOSITION:  PACU - hemodynamically stable.   Delay start of Pharmacological VTE agent (>24hrs) due to surgical blood loss or risk of bleeding: not applicable

## 2012-05-21 NOTE — Anesthesia Preprocedure Evaluation (Signed)
Anesthesia Evaluation  Patient identified by MRN, date of birth, ID band Patient awake    Reviewed: Allergy & Precautions, H&P , NPO status , Patient's Chart, lab work & pertinent test results  Airway Mallampati: II  Neck ROM: full    Dental   Pulmonary          Cardiovascular     Neuro/Psych    GI/Hepatic   Endo/Other    Renal/GU      Musculoskeletal   Abdominal   Peds  Hematology   Anesthesia Other Findings   Reproductive/Obstetrics                           Anesthesia Physical Anesthesia Plan  ASA: I  Anesthesia Plan: General   Post-op Pain Management:    Induction: Intravenous  Airway Management Planned: Oral ETT  Additional Equipment:   Intra-op Plan:   Post-operative Plan: Extubation in OR  Informed Consent: I have reviewed the patients History and Physical, chart, labs and discussed the procedure including the risks, benefits and alternatives for the proposed anesthesia with the patient or authorized representative who has indicated his/her understanding and acceptance.     Plan Discussed with: CRNA and Surgeon  Anesthesia Plan Comments:         Anesthesia Quick Evaluation  

## 2012-05-22 ENCOUNTER — Encounter (HOSPITAL_BASED_OUTPATIENT_CLINIC_OR_DEPARTMENT_OTHER): Payer: Self-pay | Admitting: Otolaryngology

## 2012-05-22 NOTE — Op Note (Signed)
NAMEEVALYSE, STROOPE            ACCOUNT NO.:  [id number]  MEDICAL RECORD NO.:  [id number]  LOCATION:                                 FACILITY:  PHYSICIAN:  Kinnie Scales. Annalee Genta, M.D.DATE OF BIRTH:  Oct 27, 1980  DATE OF PROCEDURE:  05/21/2012 DATE OF DISCHARGE:                              OPERATIVE REPORT   PREOPERATIVE DIAGNOSES: 1. Chronic pan sinusitis. 2. Deviated nasal septum with airway obstruction. 3. Inferior turbinate hypertrophy.  POSTOPERATIVE DIAGNOSIS: 1. Chronic pan sinusitis. 2. Deviated nasal septum with airway obstruction. 3. Inferior turbinate hypertrophy.  INDICATIONS FOR SURGERY: 1. Chronic pan sinusitis. 2. Deviated nasal septum with airway obstruction. 3. Inferior turbinate hypertrophy.  SURGICAL PROCEDURES: 1. Bilateral endoscopic sinus surgery with intraoperative computer-     assisted navigation (fusion) consisting of bilateral maxillary     antrostomies with removal of diseased tissue, bilateral total     ethmoidectomies, bilateral nasal frontal recess exploration and     left sphenoidotomy with removal of diseased tissue. 2. Nasal septoplasty. 3. Bilateral inferior turbinate reduction.  ANESTHESIA:  General endotracheal.  COMPLICATIONS:  None.  ESTIMATED BLOOD LOSS:  Approximately 200 mL.  CONDITION:  The patient was transferred from the operating room to the recovery room in stable condition.  FINDINGS:  Severely deviated nasal septum with extensive bilateral polypoid sinus disease and thick inspissated mucus secretions in the maxillary, frontal, and sphenoid sinuses.  Bilateral Doyle nasal septal splints placed, no packing.  BRIEF HISTORY:  The patient is a 32 year old white female with a history of chronic respiratory issues and sinusitis.  She has noted a progressive history of nasal airway obstruction, worse over the last three years with increasing issues with allergy and mild asthma.  The patient is a distance runner and has  significant issues with fatigue and difficulty breathing.  Over the last 9 months, the patient has had progressive issues with sinusitis, and in the last 6 months, has been on near continuous antibiotics for chronic sinusitis with symptoms of headache, congestion, discharge, and exacerbation of asthma.  Despite broad-spectrum antibiotic therapy, topical and oral steroids, nasal saline spray and antihistamine, she has continued to have worsening symptoms.  CT scan was performed after antibiotic therapy which showed extensive sinonasal disease involving all sinuses and deviated nasal septum.  Given her history, findings and physical examination with failure to respond to appropriate medical therapy, I recommended the above surgical procedures.  The risks and benefits were discussed in detail with the patient and her family and they understood and concurred with our plan for surgery which was scheduled on an outpatient basis at [facility] on May 21, 2012.  Prior to surgery, a complete CT scan of the sinuses for intraoperative computer-assisted navigation was performed and this was utilized throughout the surgical sinus component of the surgical procedure.  DESCRIPTION OF PROCEDURE:  The patient was brought to the operating room on May 21, 2012, and placed in supine position on the operating table.  General endotracheal anesthesia was established without difficulty.  With the patient adequately anesthetized, she was positioned on the operating table and prepped and draped.  Her nose then injected with a total of 9 mL of 1%  lidocaine 1:100,000 solution epinephrine injected in submucosal fashion along the lateral nasal wall, middle turbinate, nasal septum, and inferior turbinates bilaterally. Her nose was then packed with Afrin-soaked cottonoid pledgets and left in place for approximately 10 minutes for vasoconstriction hemostasis. The Xomed fusion navigation  headgear was applied, and anatomic and surgical landmarks were identified and confirmed.  The patient was prepared for surgery.  I began on the patient's left-hand side.  Endoscopic sinus surgery was undertaken.  Using a 0 degree endoscope, the middle turbinate was carefully medialized.  The uncinate process was reflected anteriorly and resected and a total ethmoidectomy was performed dissecting from anterior to posterior resecting the ethmoid bulla along the floor of the ethmoid sinus.  The left ethmoid sinus had polypoid disease.  No evidence of purulent material or pus.  Switching to the 45 degree telescope and using the curved microdebrider, dissection was carried from posterior to anterior along the roof of the ethmoid sinus.  The nasofrontal recess was identified and again under direct visualization with the endoscope, the nasal frontal recess was widely opened removing underlying the ethmoid cells and diseased mucosa.  Attention was then turned to the lateral nasal wall where residual uncinate process was resected.  The left maxillary sinus had polypoid material within it, which was resected with a curved microdebrider.  The natural ostium of the sinus was enlarged in an inferior and posterior direction creating a widely patent maxillary antrostomy.  Sphenoidotomy was then performed by transecting the posterior aspect of the middle turbinate, identifying the superior turbinate and dissecting from anterior to posterior.  The natural ostium of the sphenoid sinus was identified.  This completely occluded with polypoid disease.  A posterior sphenoidotomy was then performed under direct visualization with navigation and a straight microdebrider.  This connected directly to the natural sphenoid ostium and polypoid mucosa was resected from within the sinus.  There was thick mucoid material but no evidence of infection.  Attention was then turned to the nasal septum were septoplasty was  performed.  The left anterior hemitransfixion incision was created.  The mucoperichondrial flap elevated from anterior to posterior on the left-hand side.  Bony cartilaginous junction was crossed and the midseptal cartilage was removed, later morselized and returned to the mucoperichondrial pocket. A large bony septal spur along the inferior aspect of the right nasal passageway and superiorly on the right was also resected creating good midline nasal septum.  The cartilage was returned to the mucoperichondrial pocket and the flaps were reapproximated with a 4-0 gut suture on a Keith needle in a horizontal mattress fashion.  At the conclusion of the surgical procedure, bilateral Doyle nasal septal splints were placed after the application of Bactroban ointment and sutured into position with a 3-0 Ethilon suture.  Attention was then turned to the patient's right-hand side.  I again began with a 0 degree endoscope.  The middle turbinate was carefully medialized.  Uncinate process was resected.  Ethmoid bulla was transected from anterior to posterior along the floor of the ethmoid sinus.  The posterior ethmoid cells were identified.  There was no evidence of sphenoid disease on the right side and a sphenoidotomy was not performed.  Dissection was then carried from posterior to anterior along the roof of the ethmoid sinus using a 45-degree telescope and a curved microdebrider with navigation.  The nasal frontal recess was completely occluded with underlying ethmoid air cells and polypoid disease.  Again, using a 40 and 60 degree microdebrider, the natural ostium of  the frontal sinus was enlarged.  Polypoid disease was resected and at the conclusion of the procedure widely patent.  Attention was then turned to the lateral nasal wall.  Using a 45-degree telescope and a curved microdebrider, polypoid disease was resected from the natural ostium of the sinus.  There was a posterior accessory  ostium and the intervening soft tissue was resected.  Within the sinus itself, there was heavy polypoid disease which was resected with a curved microdebrider.  Attention was then turned to the inferior turbinates where turbinate reduction was performed.  Bipolar cautery set at 12 watts was used.  Two submucosal passes were made in each inferior turbinate.  When the turbinates had been adequately cauterized, anterior incisions were created, overlying mucosa elevated and a small amount of turbinate bone was resected.  The turbinates were then outfractured to create a more patent nasal cavity.  The patient's nasal passageway and sinuses were carefully inspected. Surgical debris was cleared.  Sinuses were irrigated.  There was no active bleeding, and the sinuses were widely patent.  No packing was placed.  The patient's nasal cavity and nasopharynx were irrigated. Oral cavity was suctioned.  Orogastric tube passed.  Stomach contents were aspirated.  The patient was then awakened from anesthetic.  She was extubated and transferred from the operating room to the recovery room in stable condition.  There were no complications and the estimated blood loss was less than 200 mL.          ______________________________ Kinnie Scales. Annalee Genta, M.D.     DLS/MEDQ  D:  24/40/1027  T:  05/22/2012  Job:  [id number]

## 2012-06-30 ENCOUNTER — Other Ambulatory Visit: Payer: Self-pay

## 2012-12-06 ENCOUNTER — Encounter: Payer: Self-pay | Admitting: Internal Medicine

## 2012-12-06 MED ORDER — MOMETASONE FUROATE 220 MCG/INH IN AEPB: 2.0000 | INHALATION_SPRAY | Freq: Every day | RESPIRATORY_TRACT | Status: DC

## 2013-03-21 ENCOUNTER — Other Ambulatory Visit: Payer: Self-pay

## 2013-03-25 ENCOUNTER — Encounter: Payer: Self-pay | Admitting: Internal Medicine

## 2013-05-20 IMAGING — CT CT PARANASAL SINUSES LIMITED
1 series · 16 of 30 positions shown, 20 images · non-contrast
Comparison: None

CLINICAL DATA: Acute sinusitis.

CT PARANASAL SINUS LIMITED WITHOUT CONTRAST
TECHNIQUE: Multidetector CT images of the paranasal sinuses were
obtained in a single plane without contrast.

[Series 3: ltd. sinus-pron 2.4 h41s · axial · 0.41mm/px · z∈[+593,+728]mm · 16 of 60 slices shown, 20 images]
[im 3/60  brain]
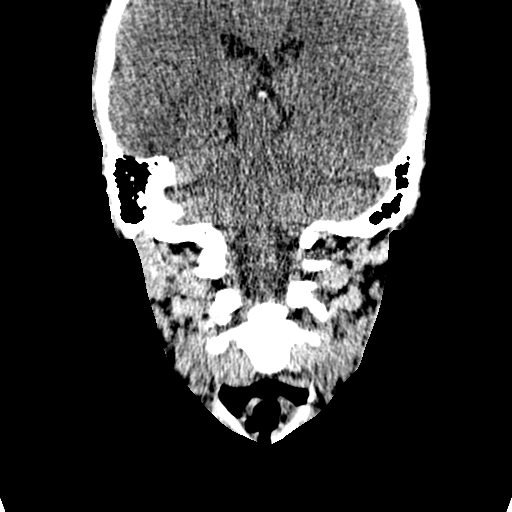
[im 3/60  bone]
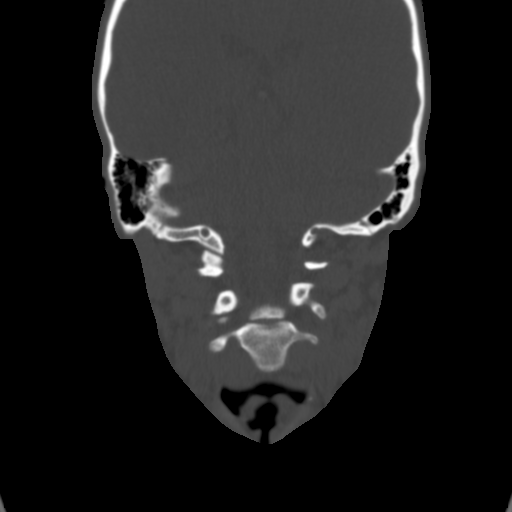
[im 7/60  bone]
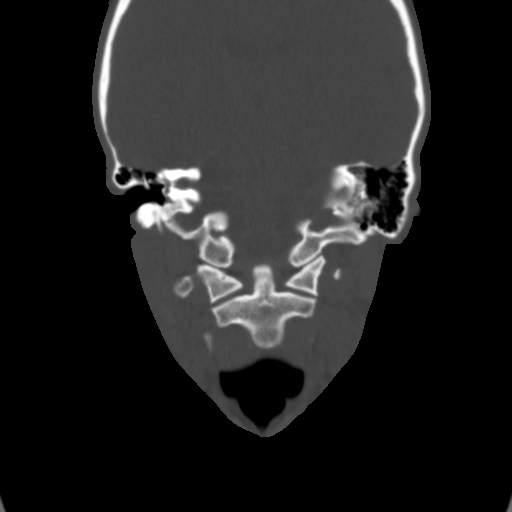
[im 11/60  bone]
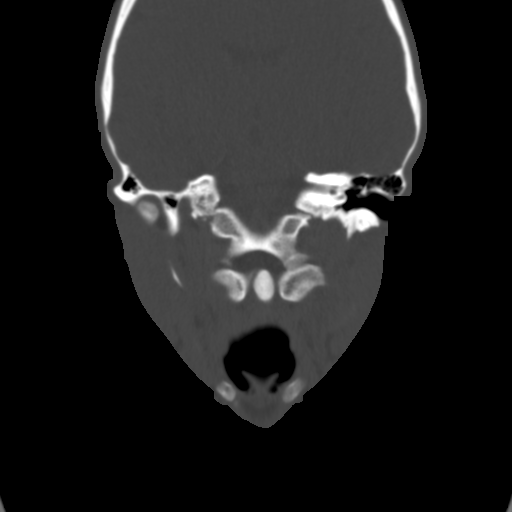
[im 15/60  bone]
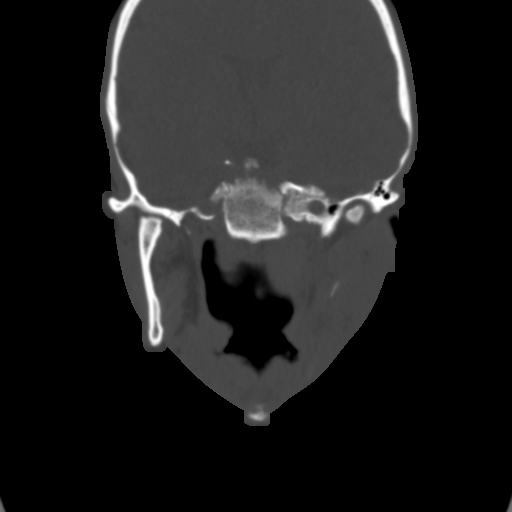
[im 17/60  brain]
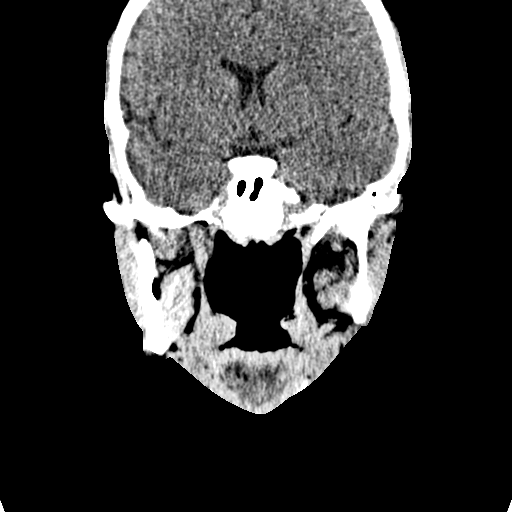
[im 17/60  bone]
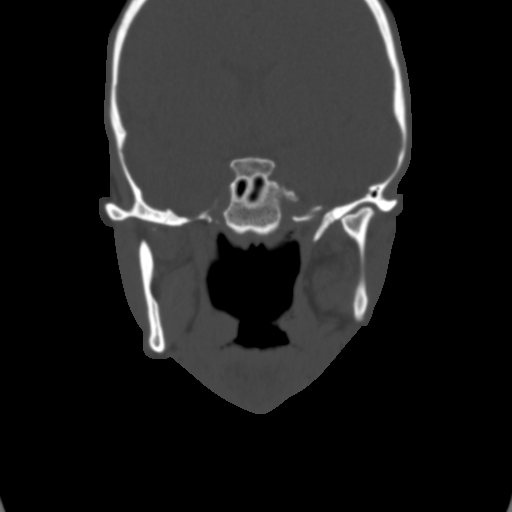
[im 21/60  bone]
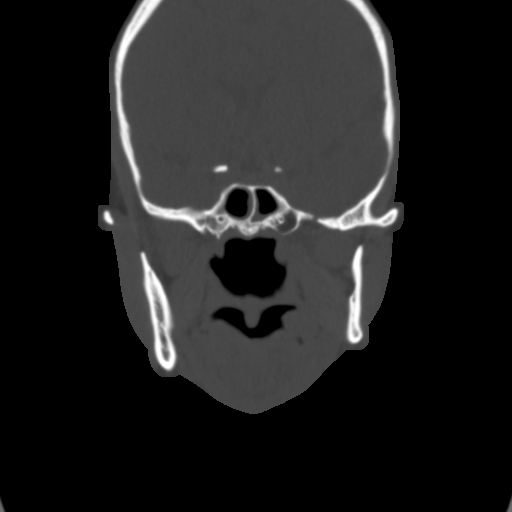
[im 25/60  bone]
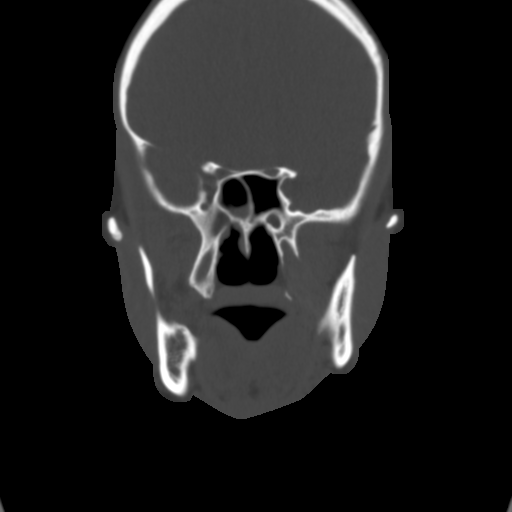
[im 29/60  bone]
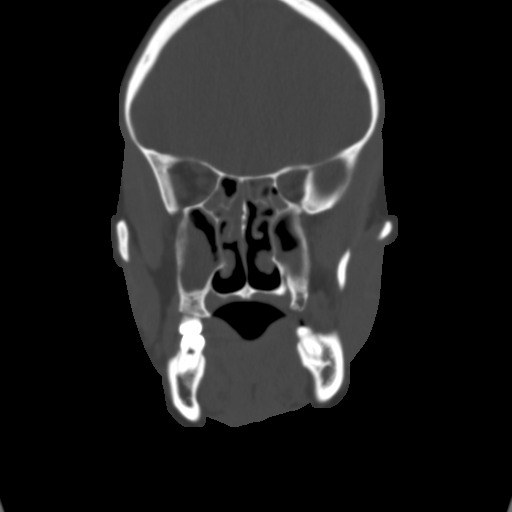
[im 31/60  brain]
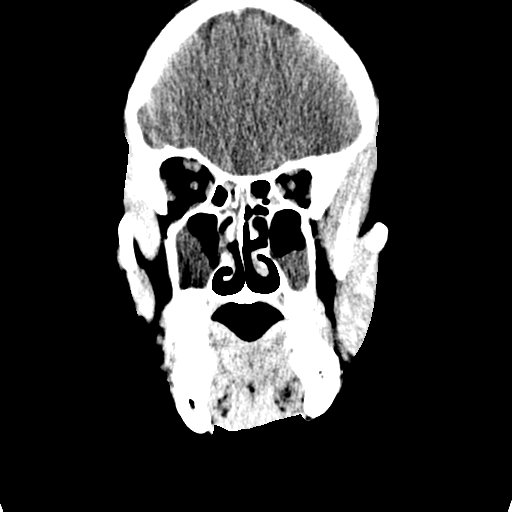
[im 31/60  bone]
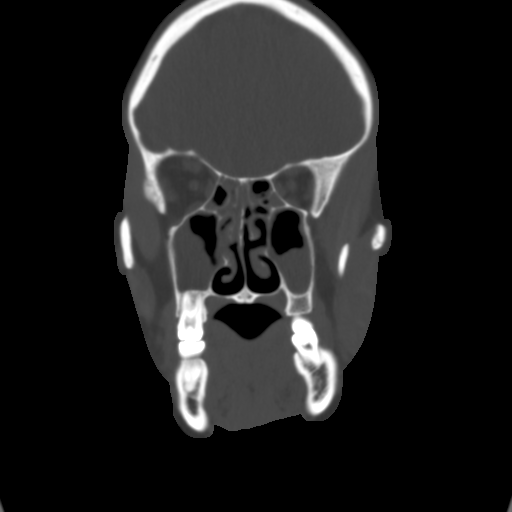
[im 35/60  bone]
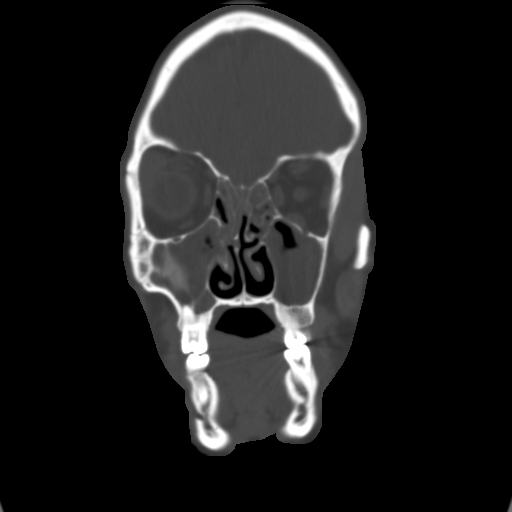
[im 39/60  bone]
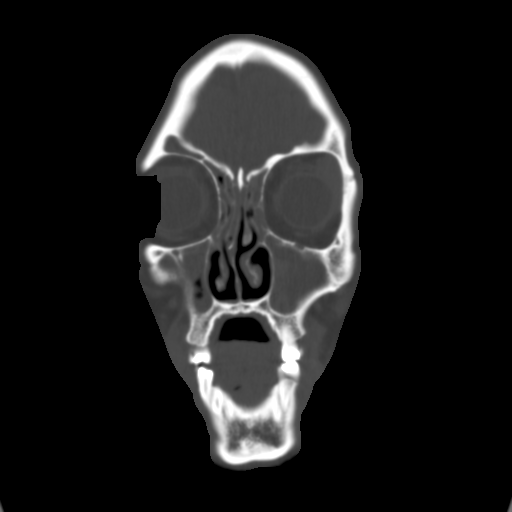
[im 43/60  bone]
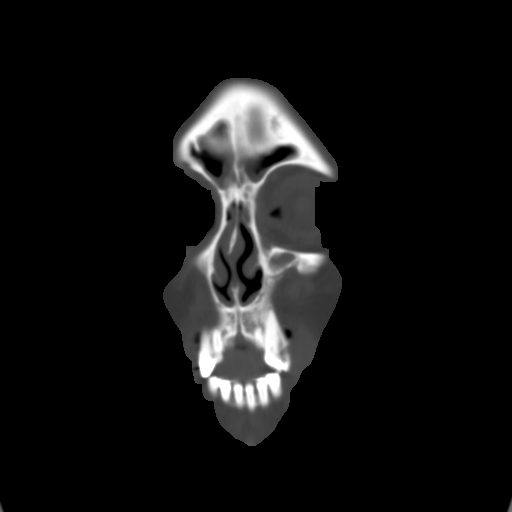
[im 45/60  brain]
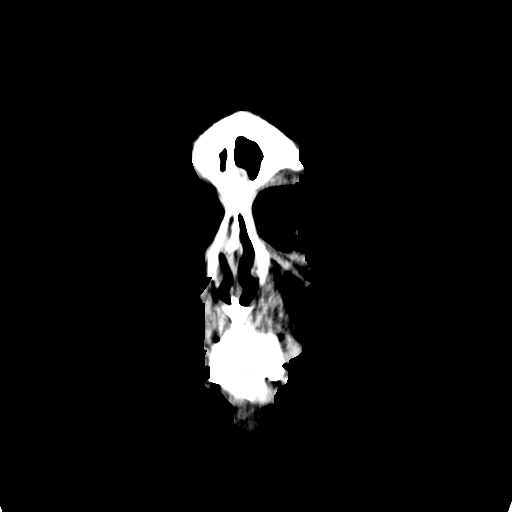
[im 45/60  bone]
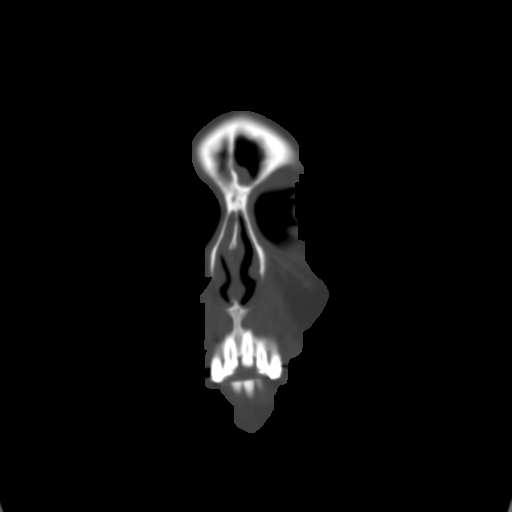
[im 49/60  bone]
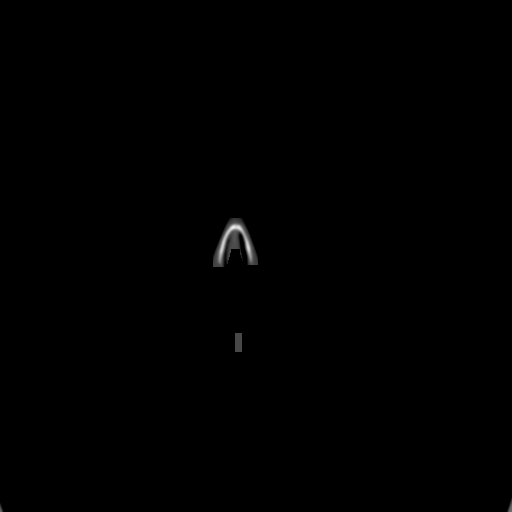
[im 53/60  bone]
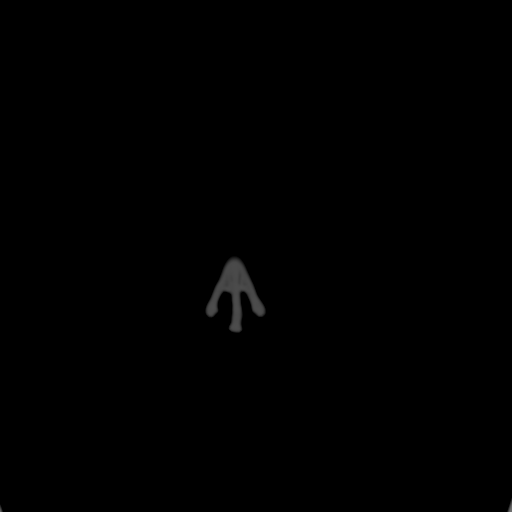
[im 57/60  bone]
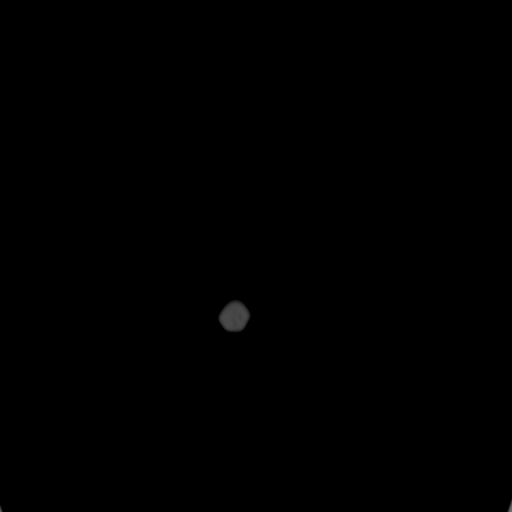

[16 of 30 positions shown; findings below may reference images not displayed]

FINDINGS: Extensive mucosal edema throughout the paranasal sinuses
bilaterally.  This involves all of the sinuses.  Air-fluid levels
are present in the maxillary sinuses bilaterally.

No bony destruction.  The orbit appears normal.  Visualized mastoid
sinus and middle ear are clear.
IMPRESSION: Extensive sinusitis with air-fluid levels.

## 2013-11-17 LAB — HM PAP SMEAR

## 2014-05-02 LAB — OB RESULTS CONSOLE GC/CHLAMYDIA
Chlamydia: NEGATIVE
GC PROBE AMP, GENITAL: NEGATIVE

## 2014-05-02 LAB — OB RESULTS CONSOLE ABO/RH: RH Type: NEGATIVE

## 2014-05-02 LAB — OB RESULTS CONSOLE RUBELLA ANTIBODY, IGM: Rubella: IMMUNE

## 2014-05-02 LAB — OB RESULTS CONSOLE HIV ANTIBODY (ROUTINE TESTING): HIV: NONREACTIVE

## 2014-05-02 LAB — OB RESULTS CONSOLE ANTIBODY SCREEN: Antibody Screen: NEGATIVE

## 2014-05-02 LAB — OB RESULTS CONSOLE HEPATITIS B SURFACE ANTIGEN: Hepatitis B Surface Ag: NEGATIVE

## 2014-05-02 LAB — OB RESULTS CONSOLE RPR: RPR: NONREACTIVE

## 2014-05-16 NOTE — L&D Delivery Note (Signed)
Operative Delivery Note At 1:40 PM a viable and healthy female was delivered via Vaginal, Vacuum Neurosurgeon).  Presentation: vertex; Position: Left,, Occiput,, Posterior; Station: +3.  Verbal consent: obtained from patient.  Risks and benefits discussed in detail.  Risks include, but are not limited to the risks of anesthesia, bleeding, infection, damage to maternal tissues, fetal cephalhematoma.  There is also the risk of inability to effect vaginal delivery of the head, or shoulder dystocia that cannot be resolved by established maneuvers, leading to the need for emergency cesarean section. Patient's efforts were excellent but FHTs noted decels and she was recommended to proceed with vacuum assistance after counseling. She agreed. One controlled vacuum pull application at +3 station and 5 pushes over single contraction achieved head delivery. Loop of cord noted in front of the chest but no nuchal or body cord noted. Rest of the body delivered easily with control.   APGAR: 9, 9; weight 7 lb 5.1 oz (3320 g).   Placenta status: Intact, Spontaneous, sent to Pathology.   Cord: 2 vessels with the following complications: None.  Cord pH: sent   Anesthesia: Epidural  Instruments: Mityvac Episiotomy: None Lacerations: 2nd degree Suture Repair: 3.0 vicryl rapide Est. Blood Loss (mL): 150  Mom to postpartum.  Baby to Couplet care / Skin to Skin.  Denham Mose R 12/01/2014, 4:06 PM

## 2014-09-24 ENCOUNTER — Encounter: Payer: 59 | Attending: Obstetrics & Gynecology

## 2014-09-24 VITALS — Ht 66.5 in | Wt 144.8 lb

## 2014-09-24 DIAGNOSIS — O24419 Gestational diabetes mellitus in pregnancy, unspecified control: Secondary | ICD-10-CM | POA: Insufficient documentation

## 2014-09-24 DIAGNOSIS — Z713 Dietary counseling and surveillance: Secondary | ICD-10-CM | POA: Insufficient documentation

## 2014-09-24 NOTE — Progress Notes (Signed)
  Patient was seen on 09/24/14 for Gestational Diabetes self-management . The following learning objectives were met by the patient :   States the definition of Gestational Diabetes  States why dietary management is important in controlling blood glucose  Describes the effects of carbohydrates on blood glucose levels  Demonstrates ability to create a balanced meal plan  Demonstrates carbohydrate counting   States when to check blood glucose levels  Demonstrates proper blood glucose monitoring techniques  States the effect of stress and exercise on blood glucose levels  States the importance of limiting caffeine and abstaining from alcohol and smoking  Plan:  Aim for 2 Carb Choices per meal (30 grams) +/- 1 either way for breakfast Aim for 3 Carb Choices per meal (45 grams) +/- 1 either way from lunch and dinner Aim for 1-2 Carbs per snack Begin reading food labels for Total Carbohydrate and sugar grams of foods Consider  increasing your activity level by walking daily as tolerated Begin checking BG before breakfast and 2 hours after first bit of breakfast, lunch and dinner after  as directed by MD  Take medication  as directed by MD  Blood glucose monitor given: True Result Lot # [id number]  Exp: 03/15/16 Blood glucose reading: 73m/dl  Patient instructed to monitor glucose levels: FBS: 60 - <90 2 hour: <120  Patient received the following handouts:  Nutrition Diabetes and Pregnancy  Carbohydrate Counting List  Meal Planning worksheet  Patient will be seen for follow-up as needed.

## 2014-11-14 LAB — OB RESULTS CONSOLE GBS: GBS: POSITIVE

## 2014-11-24 ENCOUNTER — Other Ambulatory Visit: Payer: Self-pay | Admitting: Obstetrics & Gynecology

## 2014-11-25 ENCOUNTER — Encounter (HOSPITAL_COMMUNITY): Payer: Self-pay | Admitting: *Deleted

## 2014-11-25 ENCOUNTER — Telehealth (HOSPITAL_COMMUNITY): Payer: Self-pay | Admitting: *Deleted

## 2014-11-25 NOTE — Telephone Encounter (Signed)
Preadmission screen  

## 2014-11-30 ENCOUNTER — Inpatient Hospital Stay (HOSPITAL_COMMUNITY)
Admission: RE | Admit: 2014-11-30 | Discharge: 2014-12-03 | DRG: 775 | Disposition: A | Discharge status: Home / Self Care | Payer: 59 | Source: Ambulatory Visit | Attending: Obstetrics & Gynecology | Admitting: Obstetrics & Gynecology

## 2014-11-30 ENCOUNTER — Encounter (HOSPITAL_COMMUNITY): Payer: Self-pay

## 2014-11-30 DIAGNOSIS — J45909 Unspecified asthma, uncomplicated: Secondary | ICD-10-CM | POA: Diagnosis present

## 2014-11-30 DIAGNOSIS — Z3A39 39 weeks gestation of pregnancy: Secondary | ICD-10-CM | POA: Diagnosis not present

## 2014-11-30 DIAGNOSIS — O24420 Gestational diabetes mellitus in childbirth, diet controlled: Secondary | ICD-10-CM | POA: Diagnosis present

## 2014-11-30 DIAGNOSIS — O9952 Diseases of the respiratory system complicating childbirth: Secondary | ICD-10-CM | POA: Diagnosis present

## 2014-11-30 DIAGNOSIS — Z6791 Unspecified blood type, Rh negative: Secondary | ICD-10-CM | POA: Diagnosis not present

## 2014-11-30 DIAGNOSIS — O26893 Other specified pregnancy related conditions, third trimester: Secondary | ICD-10-CM | POA: Diagnosis present

## 2014-11-30 DIAGNOSIS — O99824 Streptococcus B carrier state complicating childbirth: Secondary | ICD-10-CM | POA: Diagnosis present

## 2014-11-30 DIAGNOSIS — Z3490 Encounter for supervision of normal pregnancy, unspecified, unspecified trimester: Secondary | ICD-10-CM

## 2014-11-30 LAB — CBC
HEMATOCRIT: 37.1 % (ref 36.0–46.0)
Hemoglobin: 12.6 g/dL (ref 12.0–15.0)
MCH: 32.2 pg (ref 26.0–34.0)
MCHC: 34 g/dL (ref 30.0–36.0)
MCV: 94.9 fL (ref 78.0–100.0)
Platelets: 146 10*3/uL — ABNORMAL LOW (ref 150–400)
RBC: 3.91 MIL/uL (ref 3.87–5.11)
RDW: 13.8 % (ref 11.5–15.5)
WBC: 7.6 10*3/uL (ref 4.0–10.5)

## 2014-11-30 LAB — GLUCOSE, RANDOM: GLUCOSE: 90 mg/dL (ref 65–99)

## 2014-11-30 LAB — GLUCOSE, CAPILLARY: GLUCOSE-CAPILLARY: 75 mg/dL (ref 65–99)

## 2014-11-30 MED ORDER — OXYCODONE-ACETAMINOPHEN 5-325 MG PO TABS
2.0000 | ORAL_TABLET | ORAL | Status: DC | PRN
Start: 2014-11-30 — End: 2014-12-01

## 2014-11-30 MED ORDER — FLEET ENEMA 7-19 GM/118ML RE ENEM: 1.0000 | ENEMA | RECTAL | Status: DC | PRN

## 2014-11-30 MED ORDER — TERBUTALINE SULFATE 1 MG/ML IJ SOLN: 0.2500 mg | Freq: Once | INTRAMUSCULAR | Status: AC | PRN

## 2014-11-30 MED ORDER — BUTORPHANOL TARTRATE 1 MG/ML IJ SOLN
1.0000 mg | INTRAMUSCULAR | Status: DC | PRN
  Administered 2014-12-01: 1 mg via INTRAVENOUS
  Filled 2014-11-30: qty 1

## 2014-11-30 MED ORDER — OXYTOCIN 40 UNITS IN LACTATED RINGERS INFUSION - SIMPLE MED: 62.5000 mL/h | INTRAVENOUS | Status: DC

## 2014-11-30 MED ORDER — OXYTOCIN BOLUS FROM INFUSION: 500.0000 mL | INTRAVENOUS | Status: DC

## 2014-11-30 MED ORDER — ONDANSETRON HCL 4 MG/2ML IJ SOLN
4.0000 mg | Freq: Four times a day (QID) | INTRAMUSCULAR | Status: DC | PRN
Start: 2014-11-30 — End: 2014-12-01
  Administered 2014-12-01: 4 mg via INTRAVENOUS
  Filled 2014-11-30: qty 2

## 2014-11-30 MED ORDER — CLINDAMYCIN PHOSPHATE 900 MG/50ML IV SOLN
900.0000 mg | Freq: Three times a day (TID) | INTRAVENOUS | Status: DC
  Administered 2014-11-30 – 2014-12-01 (×3): 900 mg via INTRAVENOUS
  Filled 2014-11-30 (×6): qty 50

## 2014-11-30 MED ORDER — LIDOCAINE HCL (PF) 1 % IJ SOLN
30.0000 mL | INTRAMUSCULAR | Status: DC | PRN
  Filled 2014-11-30: qty 30

## 2014-11-30 MED ORDER — MISOPROSTOL 25 MCG QUARTER TABLET
25.0000 ug | ORAL_TABLET | ORAL | Status: DC | PRN
  Administered 2014-11-30 (×2): 25 ug via VAGINAL
  Filled 2014-11-30: qty 1
  Filled 2014-11-30 (×2): qty 0.25

## 2014-11-30 MED ORDER — LACTATED RINGERS IV SOLN
INTRAVENOUS | Status: DC
  Administered 2014-11-30 (×2): via INTRAVENOUS

## 2014-11-30 MED ORDER — OXYCODONE-ACETAMINOPHEN 5-325 MG PO TABS: 1.0000 | ORAL_TABLET | ORAL | Status: DC | PRN

## 2014-11-30 MED ORDER — CITRIC ACID-SODIUM CITRATE 334-500 MG/5ML PO SOLN: 30.0000 mL | ORAL | Status: DC | PRN

## 2014-11-30 MED ORDER — LACTATED RINGERS IV SOLN
500.0000 mL | INTRAVENOUS | Status: DC | PRN
  Administered 2014-11-30 – 2014-12-01 (×2): 500 mL via INTRAVENOUS

## 2014-11-30 MED ORDER — ACETAMINOPHEN 325 MG PO TABS: 650.0000 mg | ORAL_TABLET | ORAL | Status: DC | PRN

## 2014-11-30 NOTE — H&P (Signed)
Michelle Alvarez is a 34 y.o. female presenting for labor induction at 39.5 wks for two vessel umbilical cord. Patient is G1, diet controlled GDM.  PNCare Dr Raniya Golembeski/ Erling Conte Ob. Single Umbilical artery in cord with normal anatomy sono and fetal echo. Interval growth sono normal but drop in AC and overall growth noted. Low maternal weight gain with well controlled A1GDM. Antesting from 36 wks reactive wkly NSTs. GBS(+), sensitive to Clindamycin. History OB History    Gravida Para Term Preterm AB TAB SAB Ectopic Multiple Living   1              Past Medical History  Diagnosis Date  . Deviated nasal septum 04/2012  . Chronic sinusitis 04/2012  . Cough 05/14/2012  . Stuffy and runny nose 05/14/2012    clear drainage from nose  . Hx of varicella   . Asthma   . 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 Gestational diabetes     diet controlled   Past Surgical History  Procedure Laterality Date  . Wisdom tooth extraction    . Arthroscopic repair acl      bilateral  . Knee arthroscopy      right  . Sinus endo w/fusion  05/21/2012    Procedure: ENDOSCOPIC SINUS SURGERY WITH FUSION NAVIGATION;  Surgeon: Jerrell Belfast, MD;  Location: [facility];  Service: ENT;  Laterality: Bilateral;  BILATERAL ENDOSCOPIC SINUS SURGERY  WITH FUSION SCAN   . Nasal septoplasty w/ turbinoplasty  05/21/2012    Procedure: NASAL SEPTOPLASTY WITH TURBINATE REDUCTION;  Surgeon: Jerrell Belfast, MD;  Location: [facility];  Service: ENT;  Laterality: Bilateral;  NASAL SEPTOPLATY AND BILATERAL INFERIOR TURBINATE REDUCTION    Family History: family history includes Arthritis in her maternal grandfather and maternal grandmother; Breast cancer in her maternal grandmother; Cancer in her maternal grandmother and paternal grandfather; Colon cancer in her paternal grandfather; Diabetes in her father; Hyperlipidemia in her father; Hypertension in her father. Social History:  reports that she has  never smoked. She has never used smokeless tobacco. She reports that she drinks alcohol. She reports that she does not use illicit drugs.   Prenatal Transfer Tool  Maternal Diabetes: Yes:  Diabetes Type:  Diet controlled Genetic Screening: Normal Maternal Ultrasounds/Referrals: Abnormal:  Findings:   Other: Single Umbilical artery in cord, normal fetal echo Fetal Ultrasounds or other Referrals:  Fetal echo Maternal Substance Abuse:  No Significant Maternal Medications:  None Significant Maternal Lab Results:  Lab values include: Group B Strep positive. Rh Negative, s/p Rhogam Other Comments:  None  ROS  Exam Physical Exam  BP 110/60 mmHg  Pulse 60  Temp(Src) 98.7 F (37.1 C) (Oral)  Resp 20  LMP 02/27/2014   A&O x 3, no acute distress. Pleasant HEENT neg, no thyromegaly Lungs CTA bilat CV RRR, S1S2 normal Abdo soft, non tender, non acute Extr no edema/ tenderness Pelvic per RN closed, soft Dilation: Fingertip, Effacement (%): 60, Station: -2 (Exam by:: katherine g jones rn) FHT  120-125/ moderate variab, accels noted, no decels. Category I Toco irreg, rare.   Prenatal labs: ABO, Rh: O/Negative/-- (12/18 0000) Antibody: Negative (12/18 0000) Rubella: Immune (12/18 0000) RPR: Nonreactive (12/18 0000)  HBsAg: Negative (12/18 0000)  HIV: Non-reactive (12/18 0000)  GBS: Positive (07/01 0000)  Abn Glucola- GDM.  Assessment/Plan: 34 yo, G1 at 39.5 wks, with single umbilical artery cord here for induction. Also, well controlled A1GDM, check Random Glucose now and every  4 hrs in active labor.  Cytotec f/by pitocin as needed. GBS(+), start Clindamycin per protocol. FHT in category I. Adequate pelvis, Anticipate SVD, EFW 7 lbs.  Daryll Spisak R 11/30/2014, 9:53 AM

## 2014-11-30 NOTE — Progress Notes (Signed)
Michelle Alvarez is a 34 y.o. G1P0 at [redacted]w[redacted]d, labor IOL for 2 vessel umbilical cord  Subjective: No complaints, but feeling UCs like cramps. No bleeding. Leaking fluid.   Objective: BP 98/51 mmHg  Pulse 61  Temp(Src) 97.7 F (36.5 C) (Oral)  Resp 18  Ht 5\' 7"  (4.944 m)  Wt 151 lb (68.493 kg)  BMI 23.64 kg/m2  LMP 02/27/2014    FHT:  FHR: 140-150 bpm, variability: moderate,  accelerations:  Present,  decelerations:  Absent UC:   regular, every 2-5 minutes SVE:   Dilation: 1 Effacement (%): 50 Station: -2 Exam by:: Michelle Alvarez Feels 2 cm/ 70% effaced with UC. Foley balloon placed incracervical to help progress, tolerated well.   Labs: Lab Results  Component Value Date   WBC 7.6 11/30/2014   HGB 12.6 11/30/2014   HCT 37.1 11/30/2014   MCV 94.9 11/30/2014   PLT 146* 11/30/2014    Assessment / Plan: Induction of labor due to Single umbilical artery cord, ,  progressing well on pitocin. A1GDM, normal glucose check.   Labor: Early labor, start pitocin if UCs space out at 1 mu rate and incr by 1 mu. Preeclampsia:  N/A Fetal Wellbeing:  Category I Pain Control:  Labor support without medications I/D:  GBS(+), on Clindamycin Anticipated MOD:  NSVD  Michelle Alvarez R 11/30/2014, 11:00 PM

## 2014-12-01 ENCOUNTER — Inpatient Hospital Stay (HOSPITAL_COMMUNITY): Payer: 59 | Admitting: Anesthesiology

## 2014-12-01 ENCOUNTER — Encounter (HOSPITAL_COMMUNITY): Payer: Self-pay

## 2014-12-01 LAB — RPR: RPR Ser Ql: NONREACTIVE

## 2014-12-01 MED ORDER — SIMETHICONE 80 MG PO CHEW
80.0000 mg | CHEWABLE_TABLET | ORAL | Status: DC | PRN
Start: 2014-12-01 — End: 2014-12-03
  Administered 2014-12-01: 80 mg via ORAL
  Filled 2014-12-01: qty 1

## 2014-12-01 MED ORDER — DIBUCAINE 1 % RE OINT: 1.0000 "application " | TOPICAL_OINTMENT | RECTAL | Status: DC | PRN

## 2014-12-01 MED ORDER — DIPHENHYDRAMINE HCL 25 MG PO CAPS: 25.0000 mg | ORAL_CAPSULE | Freq: Four times a day (QID) | ORAL | Status: DC | PRN

## 2014-12-01 MED ORDER — LIDOCAINE HCL (PF) 1 % IJ SOLN: INTRAMUSCULAR | Status: DC | PRN

## 2014-12-01 MED ORDER — PHENYLEPHRINE 40 MCG/ML (10ML) SYRINGE FOR IV PUSH (FOR BLOOD PRESSURE SUPPORT)
80.0000 ug | PREFILLED_SYRINGE | INTRAVENOUS | Status: DC | PRN
  Filled 2014-12-01: qty 2

## 2014-12-01 MED ORDER — LANOLIN HYDROUS EX OINT: TOPICAL_OINTMENT | CUTANEOUS | Status: DC | PRN

## 2014-12-01 MED ORDER — ACETAMINOPHEN 325 MG PO TABS: 650.0000 mg | ORAL_TABLET | ORAL | Status: DC | PRN

## 2014-12-01 MED ORDER — SENNOSIDES-DOCUSATE SODIUM 8.6-50 MG PO TABS
2.0000 | ORAL_TABLET | ORAL | Status: DC
  Administered 2014-12-01 – 2014-12-02 (×2): 2 via ORAL
  Filled 2014-12-01 (×2): qty 2

## 2014-12-01 MED ORDER — FENTANYL 2.5 MCG/ML BUPIVACAINE 1/10 % EPIDURAL INFUSION (WH - ANES)
14.0000 mL/h | INTRAMUSCULAR | Status: DC | PRN
  Administered 2014-12-01: 14 mL/h via EPIDURAL

## 2014-12-01 MED ORDER — BENZOCAINE-MENTHOL 20-0.5 % EX AERO: 1.0000 "application " | INHALATION_SPRAY | CUTANEOUS | Status: DC | PRN

## 2014-12-01 MED ORDER — DIPHENHYDRAMINE HCL 50 MG/ML IJ SOLN: 12.5000 mg | INTRAMUSCULAR | Status: DC | PRN

## 2014-12-01 MED ORDER — TETANUS-DIPHTH-ACELL PERTUSSIS 5-2.5-18.5 LF-MCG/0.5 IM SUSP: 0.5000 mL | Freq: Once | INTRAMUSCULAR | Status: DC

## 2014-12-01 MED ORDER — OXYCODONE-ACETAMINOPHEN 5-325 MG PO TABS: 1.0000 | ORAL_TABLET | ORAL | Status: DC | PRN

## 2014-12-01 MED ORDER — WITCH HAZEL-GLYCERIN EX PADS: 1.0000 "application " | MEDICATED_PAD | CUTANEOUS | Status: DC | PRN

## 2014-12-01 MED ORDER — PHENYLEPHRINE 40 MCG/ML (10ML) SYRINGE FOR IV PUSH (FOR BLOOD PRESSURE SUPPORT)
PREFILLED_SYRINGE | INTRAVENOUS | Status: AC
  Filled 2014-12-01: qty 20

## 2014-12-01 MED ORDER — IBUPROFEN 600 MG PO TABS
600.0000 mg | ORAL_TABLET | Freq: Four times a day (QID) | ORAL | Status: DC
  Administered 2014-12-01 – 2014-12-03 (×8): 600 mg via ORAL
  Filled 2014-12-01 (×8): qty 1

## 2014-12-01 MED ORDER — ZOLPIDEM TARTRATE 5 MG PO TABS: 5.0000 mg | ORAL_TABLET | Freq: Every evening | ORAL | Status: DC | PRN

## 2014-12-01 MED ORDER — LIDOCAINE HCL (PF) 1 % IJ SOLN
INTRAMUSCULAR | Status: DC | PRN
  Administered 2014-12-01 (×2): 8 mL via EPIDURAL

## 2014-12-01 MED ORDER — OXYTOCIN 40 UNITS IN LACTATED RINGERS INFUSION - SIMPLE MED
1.0000 m[IU]/min | INTRAVENOUS | Status: DC
  Administered 2014-12-01: 1 m[IU]/min via INTRAVENOUS
  Filled 2014-12-01: qty 1000

## 2014-12-01 MED ORDER — ONDANSETRON HCL 4 MG PO TABS: 4.0000 mg | ORAL_TABLET | ORAL | Status: DC | PRN

## 2014-12-01 MED ORDER — ONDANSETRON HCL 4 MG/2ML IJ SOLN: 4.0000 mg | INTRAMUSCULAR | Status: DC | PRN

## 2014-12-01 MED ORDER — OXYCODONE-ACETAMINOPHEN 5-325 MG PO TABS: 2.0000 | ORAL_TABLET | ORAL | Status: DC | PRN

## 2014-12-01 MED ORDER — FENTANYL 2.5 MCG/ML BUPIVACAINE 1/10 % EPIDURAL INFUSION (WH - ANES)
INTRAMUSCULAR | Status: AC
  Filled 2014-12-01: qty 125

## 2014-12-01 MED ORDER — EPHEDRINE 5 MG/ML INJ
10.0000 mg | INTRAVENOUS | Status: DC | PRN
  Filled 2014-12-01: qty 2

## 2014-12-01 MED ORDER — TERBUTALINE SULFATE 1 MG/ML IJ SOLN
0.2500 mg | Freq: Once | INTRAMUSCULAR | Status: DC | PRN
  Filled 2014-12-01: qty 1

## 2014-12-01 MED ORDER — PRENATAL MULTIVITAMIN CH
1.0000 | ORAL_TABLET | Freq: Every day | ORAL | Status: DC
  Administered 2014-12-02 – 2014-12-03 (×2): 1 via ORAL
  Filled 2014-12-01 (×2): qty 1

## 2014-12-01 NOTE — Anesthesia Procedure Notes (Signed)
Epidural Patient location during procedure: OB Start time: 12/01/2014 8:10 AM End time: 12/01/2014 8:14 AM  Staffing Anesthesiologist: Lyn Hollingshead Performed by: anesthesiologist   Preanesthetic Checklist Completed: patient identified, surgical consent, pre-op evaluation, timeout performed, IV checked, risks and benefits discussed and monitors and equipment checked  Epidural Patient position: sitting Prep: site prepped and draped and DuraPrep Patient monitoring: continuous pulse ox and blood pressure Approach: midline Location: L2-L3 Injection technique: LOR air  Needle:  Needle type: Tuohy  Needle gauge: 17 G Needle length: 9 cm and 9 Needle insertion depth: 4 cm Catheter type: closed end flexible Catheter size: 19 Gauge Catheter at skin depth: 9 cm Test dose: negative and Other  Assessment Sensory level: T9 Events: blood not aspirated, injection not painful, no injection resistance, negative IV test and no paresthesia  Additional Notes Reason for block:procedure for pain

## 2014-12-01 NOTE — Anesthesia Preprocedure Evaluation (Signed)
Anesthesia Evaluation  Patient identified by MRN, date of birth, ID band Patient awake    Reviewed: Allergy & Precautions, H&P , NPO status , Patient's Chart, lab work & pertinent test results  Airway Mallampati: I  TM Distance: >3 FB Neck ROM: full    Dental no notable dental hx.    Pulmonary    Pulmonary exam normal       Cardiovascular negative cardio ROS Normal cardiovascular exam    Neuro/Psych negative neurological ROS  negative psych ROS   GI/Hepatic negative GI ROS, Neg liver ROS,   Endo/Other  diabetes  Renal/GU negative Renal ROS     Musculoskeletal   Abdominal Normal abdominal exam  (+)   Peds  Hematology negative hematology ROS (+)   Anesthesia Other Findings   Reproductive/Obstetrics (+) Pregnancy                             Anesthesia Physical Anesthesia Plan  ASA: II  Anesthesia Plan: Epidural   Post-op Pain Management:    Induction:   Airway Management Planned:   Additional Equipment:   Intra-op Plan:   Post-operative Plan:   Informed Consent: I have reviewed the patients History and Physical, chart, labs and discussed the procedure including the risks, benefits and alternatives for the proposed anesthesia with the patient or authorized representative who has indicated his/her understanding and acceptance.     Plan Discussed with:   Anesthesia Plan Comments:         Anesthesia Quick Evaluation

## 2014-12-01 NOTE — Progress Notes (Signed)
Michelle Alvarez is a 34 y.o. G1P0 at [redacted]w[redacted]d labor IOL for 2V cord.  Progressed well after AROM at 4 cm this morning. Now 2nd stage. Variable decels with some late component noted, so pitocin is off (was on low dose, at 2 mu since spaced out UCs after epidural, but turned off and now spontaneous labor)  BP 120/69 mmHg  Pulse 60  Temp(Src) 98.5 F (36.9 C) (Oral)  Resp 18  Ht 5\' 7"  (1.702 Michelle)  Wt 151 lb (68.493 kg)  BMI 23.64 kg/m2  SpO2 99%  LMP 02/27/2014   FHT:  FHR: 150s bpm, variability: moderate,  accelerations:  Present,  decelerations:  Present variable decels with pushing, reterun to baseline in less than 1 minute and moderate variablity maintained UC:   regular, every 4-5 minutes SVE:   Dilation: 10 Effacement (%): 80 Station: +2 Exam by:: Michelle Alvarez rnc OT/OP, continue bearing down. Restitutes at +1 but down to +2+3 with pushing. Good effort, small caput and slight moulding noted/   Assessment / Plan: IOL, 2 vessel cord, repetitive variable decels but overall category II, pushing well, anticipate vaginal delivery.  Vacuum assistance reviewed in case needed to move to delivery faster but prefer pushing, more moulding and lower station.   Michelle Alvarez R 12/01/2014, 12:32 PM

## 2014-12-02 LAB — CBC
HCT: 31.3 % — ABNORMAL LOW (ref 36.0–46.0)
Hemoglobin: 10.6 g/dL — ABNORMAL LOW (ref 12.0–15.0)
MCH: 32.2 pg (ref 26.0–34.0)
MCHC: 33.9 g/dL (ref 30.0–36.0)
MCV: 95.1 fL (ref 78.0–100.0)
PLATELETS: 129 10*3/uL — AB (ref 150–400)
RBC: 3.29 MIL/uL — ABNORMAL LOW (ref 3.87–5.11)
RDW: 13.9 % (ref 11.5–15.5)
WBC: 14.3 10*3/uL — ABNORMAL HIGH (ref 4.0–10.5)

## 2014-12-02 MED ORDER — RHO D IMMUNE GLOBULIN 1500 UNIT/2ML IJ SOSY
300.0000 ug | PREFILLED_SYRINGE | Freq: Once | INTRAMUSCULAR | Status: AC
  Administered 2014-12-02: 300 ug via INTRAVENOUS
  Filled 2014-12-02: qty 2

## 2014-12-02 NOTE — Anesthesia Postprocedure Evaluation (Signed)
  Anesthesia Post-op Note  Patient: Michelle Alvarez  Procedure(s) Performed: * No procedures listed *  Patient Location: PACU and Mother/Baby  Anesthesia Type:Epidural  Level of Consciousness: awake, alert  and oriented  Airway and Oxygen Therapy: Patient Spontanous Breathing  Post-op Pain: none  Post-op Assessment: Post-op Vital signs reviewed, Patient's Cardiovascular Status Stable, No headache, No backache, No residual numbness and No residual motor weakness  Post-op Vital Signs: Reviewed and stable  Complications: No apparent anesthesia complications

## 2014-12-02 NOTE — Progress Notes (Signed)
PPD #1- SVD  Subjective:   Reports feeling well Tolerating po/ No nausea or vomiting Bleeding is light Pain controlled with Motrin Up ad lib / ambulatory / voiding without problems Newborn: breastfeeding  / Circumcision: not planning   Objective:   VS:  VS:  Filed Vitals:   12/01/14 1515 12/01/14 1545 12/01/14 1645 12/01/14 2115  BP: 109/52 92/44 94/50  105/54  Pulse: 62 56 64 60  Temp:  98.2 F (36.8 C) 98.2 F (36.8 C) 98.5 F (36.9 C)  TempSrc:  Oral Oral Oral  Resp:  18 18 16   Height:      Weight:      SpO2:    98%    LABS:  Recent Labs  11/30/14 0855 12/02/14 0535  WBC 7.6 14.3*  HGB 12.6 10.6*  PLT 146* 129*   Blood type: --/--/O NEG (07/17 0855) Rubella: Immune (12/18 0000)   I&O: Intake/Output      07/18 0701 - 07/19 0700 07/19 0701 - 07/20 0700   Urine (mL/kg/hr) 0 (0)    Blood 150 (0.1)    Total Output 150     Net -150          Urine Occurrence 1 x      Physical Exam: Alert and oriented x3 Abdomen: soft, non-tender, non-distended  Fundus: firm, non-tender, U-2 Perineum: Well approximated, no significant erythema, edema, or drainage; healing well. Lochia: small Extremities: no edema, no calf pain or tenderness    Assessment:  PPD #1 G1P1001/ S/P:spontaneous vaginal, 2nd degree laceration A1GDM, delivered  Rh negative-baby Rh pos Doing well    Plan: Rhogam Continue routine post partum orders Anticipate D/C home tomorrow   Julianne Handler, N MSN, CNM 12/02/2014, 11:26 AM

## 2014-12-02 NOTE — Lactation Note (Signed)
This note was copied from the chart of Michelle Alvarez. Lactation Consultation Note New mom took BF classes. States BF going ok. Thinks he's getting on shallow. Encouraged to pull chin and flange for wider latch. Encouraged to call for assistance and not to let the baby BF if it's hurting. Referred to Baby and Me Book in Breastfeeding section Pg. 22-23 for position options and Proper latch demonstration. Stated he just got through BF. Discussed what a good latch feels like. Discussed positioning, I&O, supply and demand. Mom states she has colostrum and good everted nipples.  Mom encouraged to feed baby 8-12 times/24 hours and with feeding cues. Mom encouraged to waken baby for feeds. Educated about newborn behavior. [place] brochure given w/resources, support groups and [place] services. Patient Name: Michelle Alvarez Date: 12/02/2014 Reason for consult: Initial assessment   Maternal Data Has patient been taught Hand Expression?: Yes Does the patient have breastfeeding experience prior to this delivery?: No  Feeding Feeding Type: Breast Fed Length of feed: 15 min  LATCH Score/Interventions                Intervention(s): Breastfeeding basics reviewed;Support Pillows;Position options;Skin to skin     Lactation Tools Discussed/Used     Consult Status Consult Status: Follow-up Date: 12/02/14 Follow-up type: In-patient    Theodoro Kalata 12/02/2014, 12:11 AM

## 2014-12-03 LAB — RH IG WORKUP (INCLUDES ABO/RH)
ABO/RH(D): O NEG
FETAL SCREEN: NEGATIVE
GESTATIONAL AGE(WKS): 39.6
Unit division: 0

## 2014-12-03 MED ORDER — IBUPROFEN 600 MG PO TABS: 600.0000 mg | ORAL_TABLET | Freq: Four times a day (QID) | ORAL | Status: DC

## 2014-12-03 NOTE — Lactation Note (Signed)
This note was copied from the chart of Michelle Alvarez. Lactation Consultation Note: Mom reports nipples are sore but latch is getting better. Still some pain with nursing. Assisted mom in football hold- she was trying cradle but got better latch with football hold and mom reports that feels better- some pain with initial latch but eases off. Comfort gels given with instructions for use. No questions at present. Is [employer] employee and plans to get pump from [place] today. Reviewed OP appointments and [facility] as resources for support after DC. To call prn Patient Name: Michelle Alvarez [id number] Date: 12/03/2014 Reason for consult: Follow-up assessment   Maternal Data Formula Feeding for Exclusion: No Has patient been taught Hand Expression?: Yes Does the patient have breastfeeding experience prior to this delivery?: No  Feeding Feeding Type: Breast Fed  LATCH Score/Interventions Latch: Grasps breast easily, tongue down, lips flanged, rhythmical sucking.  Audible Swallowing: A few with stimulation  Type of Nipple: Everted at rest and after stimulation  Comfort (Breast/Nipple): Filling, red/small blisters or bruises, mild/mod discomfort  Problem noted: Mild/Moderate discomfort Interventions (Mild/moderate discomfort): Hand expression;Comfort gels  Hold (Positioning): Assistance needed to correctly position infant at breast and maintain latch. Intervention(s): Breastfeeding basics reviewed;Position options  LATCH Score: 7  Lactation Tools Discussed/Used WIC Program: No   Consult Status Consult Status: Complete    Truddie Crumble 12/03/2014, 9:47 AM

## 2014-12-03 NOTE — Discharge Instructions (Signed)
Breast Pumping Tips °If you are breastfeeding, there may be times when you cannot feed your baby directly. Returning to work or going on a trip are common examples. Pumping allows you to store breast milk and feed it to your baby later.  °You may not get much milk when you first start to pump. Your breasts should start to make more after a few days. If you pump at the times you usually feed your baby, you may be able to keep making enough milk to feed your baby without also using formula. The more often you pump, the more milk you will produce.  °WHEN SHOULD I PUMP?  °· You can begin to pump soon after delivery. However, some experts recommend waiting about 4 weeks before giving your infant a bottle to make sure breastfeeding is going well.  °· If you plan to return to work, begin pumping a few weeks before. This will help you develop techniques that work best for you. It also lets you build up a supply of breast milk.   °· When you are with your infant, feed on demand and pump after each feeding.   °· When you are away from your infant for several hours, pump for about 15 minutes every 2-3 hours. Pump both breasts at the same time if you can.   °· If your infant has a formula feeding, make sure to pump around the same time.     °· If you drink any alcohol, wait 2 hours before pumping.   °HOW DO I PREPARE TO PUMP? °Your let-down reflex is the natural reaction to stimulation that makes your breast milk flow. It is easier to stimulate this reflex when you are relaxed. Find relaxation techniques that work for you. If you have difficulty with your let-down reflex, try these methods:  °· Smell one of your infant's blankets or an item of clothing.   °· Look at a picture or video of your infant.   °· Sit in a quiet, private space.   °· Massage the breast you plan to pump.   °· Place soothing warmth on the breast.   °· Play relaxing music.   °WHAT ARE SOME GENERAL BREAST PUMPING TIPS? °· Wash your hands before you pump. You  do not need to wash your nipples or breasts. °· There are three ways to pump. °¨ You can use your hand to massage and compress your breast. °¨ You can use a handheld manual pump. °¨ You can use an electric pump.   °· Make sure the suction cup (flange) on the breast pump is the right size. Place the flange directly over the nipple. If it is the wrong size or placed the wrong way, it may be painful and cause nipple damage.   °· If pumping is uncomfortable, apply a small amount of purified or modified lanolin to your nipple and areola. °· If you are using an electric pump, adjust the speed and suction power to be more comfortable. °· If pumping is painful or if you are not getting very much milk, you may need a different type of pump. A lactation consultant can help you determine what type of pump to use.   °· Keep a full water bottle near you at all times. Drinking lots of fluid helps you make more milk.  °· You can store your milk to use later. Pumped breast milk can be stored in a sealable, sterile container or plastic bag. Label all stored breast milk with the date you pumped it. °¨ Milk can stay out at room temperature for up to 8 hours. °¨   You can store your milk in the refrigerator for up to 8 days. °¨ You can store your milk in the freezer for 3 months. Thaw frozen milk using warm water. Do not put it in the microwave. °· Do not smoke. Smoking can lower your milk supply and harm your infant. If you need help quitting, ask your health care provider to recommend a program.   °WHEN SHOULD I CALL MY HEALTH CARE PROVIDER OR A LACTATION CONSULTANT? °· You are having trouble pumping. °· You are concerned that you are not making enough milk. °· You have nipple pain, soreness, or redness. °· You want to use birth control. Birth control pills may lower your milk supply. Talk to your health care provider about your options. °Document Released: 10/20/2009 Document Revised: 05/07/2013 Document Reviewed:  02/22/2013 °[employer]® Patient Information ©2015 [employer]. This information is not intended to replace advice given to you by your health care provider. Make sure you discuss any questions you have with your health care provider. ° °Nutrition for the New Mother  °A new mother needs good health and nutrition so she can have energy to take care of a new baby. Whether a mother breastfeeds or formula feeds the baby, it is important to have a well-balanced diet. Foods from all the food groups should be chosen to meet the new mother's energy needs and to give her the nutrients needed for repair and healing.  °A HEALTHY EATING PLAN °The My Pyramid plan for Moms outlines what you should eat to help you and your baby stay healthy. The energy and amount of food you need depends on whether or not you are breastfeeding. If you are breastfeeding you will need more nutrients. If you choose not to breastfeed, your nutrition goal should be to return to a healthy weight. Limiting calories may be needed if you are not breastfeeding.  °HOME CARE INSTRUCTIONS  °· For a personal plan based on your unique needs, see your Registered Dietitian or visit www.mypyramid.gov. °· Eat a variety of foods. The plan below will help guide you. The following chart has a suggested daily meal plan from the My Pyramid for Moms. °· Eat a variety of fruits and vegetables. °· Eat more dark green and orange vegetables and cooked dried beans. °· Make half your grains whole grains. Choose whole instead of refined grains. °· Choose low-fat or lean meats and poultry. °· Choose low-fat or fat-free dairy products like milk, cheese, or yogurt. °Fruits °· Breastfeeding: 2 cups °· Non-Breastfeeding: 2 cups °· What Counts as a serving? °¨ 1 cup of fruit or juice. °¨ ½ cup dried fruit. °Vegetables °· Breastfeeding: 3 cups °· Non-Breastfeeding: 2 ½ cups °· What Counts as a serving? °¨ 1 cup raw or cooked vegetables. °¨ Juice or 2 cups raw leafy  vegetables. °Grains °· Breastfeeding: 8 oz °· Non-Breastfeeding: 6 oz °· What Counts as a serving? °¨ 1 slice bread. °¨ 1 oz ready-to-eat cereal. °¨ ½ cup cooked pasta, rice, or cereal. °Meat and Beans °· Breastfeeding: 6 ½ oz °· Non-Breastfeeding: 5 ½ oz °· What Counts as a serving? °¨ 1 oz lean meat, poultry, or fish °¨ ¼ cup cooked dry beans °¨ ½ oz nuts or 1 egg °¨ 1 tbs peanut butter °Milk °· Breastfeeding: 3 cups °· Non-Breastfeeding: 3 cups °· What Counts as a serving? °¨ 1 cup milk. °¨ 8 oz yogurt. °¨ 1 ½ oz cheese. °¨ 2 oz processed cheese. °TIPS FOR THE BREASTFEEDING MOM °· Rapid weight   loss is not suggested when you are breastfeeding. By simply breastfeeding, you will be able to lose the weight gained during your pregnancy. Your caregiver can keep track of your weight and tell you if your weight loss is appropriate.  Be sure to drink fluids. You may notice that you are thirstier than usual. A suggestion is to drink a glass of water or other beverage whenever you breastfeed.  Avoid alcohol as it can be passed into your breast milk.  Limit caffeine drinks to no more than 2 to 3 cups per day.  You may need to keep taking your prenatal vitamin while you are breastfeeding. Talk with your caregiver about taking a vitamin or supplement. RETURING TO A HEALTHY WEIGHT  The My Pyramid Plan for Moms will help you return to a healthy weight. It will also provide the nutrients you need.  You may need to limit "empty" calories. These include:  High fat foods like fried foods, fatty meats, fast food, butter, and mayonnaise.  High sugar foods like sodas, jelly, candy, and sweets.  Be physically active. Include 30 minutes of exercise or more each day. Choose an activity you like such as walking, swimming, biking, or aerobics. Check with your caregiver before you start to exercise. Document Released: 08/09/2007 Document Revised: 07/25/2011 Document Reviewed: 08/09/2007 [facility] Patient Information  2015 [place], [place]. This information is not intended to replace advice given to you by your health care provider. Make sure you discuss any questions you have with your health care provider. Postpartum Depression and Baby Blues The postpartum period begins right after the birth of a baby. During this time, there is often a great amount of joy and excitement. It is also a time of many changes in the life of the parents. Regardless of how many times a mother gives birth, each child brings new challenges and dynamics to the family. It is not unusual to have feelings of excitement along with confusing shifts in moods, emotions, and thoughts. All mothers are at risk of developing postpartum depression or the "baby blues." These mood changes can occur right after giving birth, or they may occur many months after giving birth. The baby blues or postpartum depression can be mild or severe. Additionally, postpartum depression can go away rather quickly, or it can be a long-term condition.  CAUSES Raised hormone levels and the rapid drop in those levels are thought to be a main cause of postpartum depression and the baby blues. A number of hormones change during and after pregnancy. Estrogen and progesterone usually decrease right after the delivery of your baby. The levels of thyroid hormone and various cortisol steroids also rapidly drop. Other factors that play a role in these mood changes include major life events and genetics.  RISK FACTORS If you have any of the following risks for the baby blues or postpartum depression, know what symptoms to watch out for during the postpartum period. Risk factors that may increase the likelihood of getting the baby blues or postpartum depression include:  Having a personal or family history of depression.   Having depression while being pregnant.   Having premenstrual mood issues or mood issues related to oral contraceptives.  Having a lot of life stress.   Having  marital conflict.   Lacking a social support network.   Having a baby with special needs.   Having health problems, such as diabetes.  SIGNS AND SYMPTOMS Symptoms of baby blues include:  Brief changes in mood, such as going  from extreme happiness to sadness.  Decreased concentration.   Difficulty sleeping.   Crying spells, tearfulness.   Irritability.   Anxiety.  Symptoms of postpartum depression typically begin within the first month after giving birth. These symptoms include:  Difficulty sleeping or excessive sleepiness.   Marked weight loss.   Agitation.   Feelings of worthlessness.   Lack of interest in activity or food.  Postpartum psychosis is a very serious condition and can be dangerous. Fortunately, it is rare. Displaying any of the following symptoms is cause for immediate medical attention. Symptoms of postpartum psychosis include:   Hallucinations and delusions.   Bizarre or disorganized behavior.   Confusion or disorientation.  DIAGNOSIS  A diagnosis is made by an evaluation of your symptoms. There are no medical or lab tests that lead to a diagnosis, but there are various questionnaires that a health care provider may use to identify those with the baby blues, postpartum depression, or psychosis. Often, a screening tool called the [place] Postnatal Depression Scale is used to diagnose depression in the postpartum period.  TREATMENT The baby blues usually goes away on its own in 1-2 weeks. Social support is often all that is needed. You will be encouraged to get adequate sleep and rest. Occasionally, you may be given medicines to help you sleep.  Postpartum depression requires treatment because it can last several months or longer if it is not treated. Treatment may include individual or group therapy, medicine, or both to address any social, physiological, and psychological factors that may play a role in the depression. Regular exercise, a  healthy diet, rest, and social support may also be strongly recommended.  Postpartum psychosis is more serious and needs treatment right away. Hospitalization is often needed. HOME CARE INSTRUCTIONS  Get as much rest as you can. Nap when the baby sleeps.   Exercise regularly. Some women find yoga and walking to be beneficial.   Eat a balanced and nourishing diet.   Do little things that you enjoy. Have a cup of tea, take a bubble bath, read your favorite magazine, or listen to your favorite music.  Avoid alcohol.   Ask for help with household chores, cooking, grocery shopping, or running errands as needed. Do not try to do everything.   Talk to people close to you about how you are feeling. Get support from your partner, family members, friends, or other new moms.  Try to stay positive in how you think. Think about the things you are grateful for.   Do not spend a lot of time alone.   Only take over-the-counter or prescription medicine as directed by your health care provider.  Keep all your postpartum appointments.   Let your health care provider know if you have any concerns.  SEEK MEDICAL CARE IF: You are having a reaction to or problems with your medicine. SEEK IMMEDIATE MEDICAL CARE IF:  You have suicidal feelings.   You think you may harm the baby or someone else. MAKE SURE YOU:  Understand these instructions.  Will watch your condition.  Will get help right away if you are not doing well or get worse. Document Released: 02/04/2004 Document Revised: 05/07/2013 Document Reviewed: 02/11/2013 [facility] Patient Information 2015 [place], [place]. This information is not intended to replace advice given to you by your health care provider. Make sure you discuss any questions you have with your health care provider. Postpartum Care After Vaginal Delivery After you deliver your newborn (postpartum period), the usual stay in  the hospital is 24-72 hours. If there were  problems with your labor or delivery, or if you have other medical problems, you might be in the hospital longer.  While you are in the hospital, you will receive help and instructions on how to care for yourself and your newborn during the postpartum period.  While you are in the hospital:  Be sure to tell your nurses if you have pain or discomfort, as well as where you feel the pain and what makes the pain worse.  If you had an incision made near your vagina (episiotomy) or if you had some tearing during delivery, the nurses may put ice packs on your episiotomy or tear. The ice packs may help to reduce the pain and swelling.  If you are breastfeeding, you may feel uncomfortable contractions of your uterus for a couple of weeks. This is normal. The contractions help your uterus get back to normal size.  It is normal to have some bleeding after delivery.  For the first 1-3 days after delivery, the flow is red and the amount may be similar to a period.  It is common for the flow to start and stop.  In the first few days, you may pass some small clots. Let your nurses know if you begin to pass large clots or your flow increases.  Do not  flush blood clots down the toilet before having the nurse look at them.  During the next 3-10 days after delivery, your flow should become more watery and pink or brown-tinged in color.  Ten to fourteen days after delivery, your flow should be a small amount of yellowish-white discharge.  The amount of your flow will decrease over the first few weeks after delivery. Your flow may stop in 6-8 weeks. Most women have had their flow stop by 12 weeks after delivery.  You should change your sanitary pads frequently.  Wash your hands thoroughly with soap and water for at least 20 seconds after changing pads, using the toilet, or before holding or feeding your newborn.  You should feel like you need to empty your bladder within the first 6-8 hours after  delivery.  In case you become weak, lightheaded, or faint, call your nurse before you get out of bed for the first time and before you take a shower for the first time.  Within the first few days after delivery, your breasts may begin to feel tender and full. This is called engorgement. Breast tenderness usually goes away within 48-72 hours after engorgement occurs. You may also notice milk leaking from your breasts. If you are not breastfeeding, do not stimulate your breasts. Breast stimulation can make your breasts produce more milk.  Spending as much time as possible with your newborn is very important. During this time, you and your newborn can feel close and get to know each other. Having your newborn stay in your room (rooming in) will help to strengthen the bond with your newborn. It will give you time to get to know your newborn and become comfortable caring for your newborn.  Your hormones change after delivery. Sometimes the hormone changes can temporarily cause you to feel sad or tearful. These feelings should not last more than a few days. If these feelings last longer than that, you should talk to your caregiver.  If desired, talk to your caregiver about methods of family planning or contraception.  Talk to your caregiver about immunizations. Your caregiver may want you to have the  following immunizations before leaving the hospital:  Tetanus, diphtheria, and pertussis (Tdap) or tetanus and diphtheria (Td) immunization. It is very important that you and your family (including grandparents) or others caring for your newborn are up-to-date with the Tdap or Td immunizations. The Tdap or Td immunization can help protect your newborn from getting ill.  Rubella immunization.  Varicella (chickenpox) immunization.  Influenza immunization. You should receive this annual immunization if you did not receive the immunization during your pregnancy. Document Released: 02/27/2007 Document  Revised: 01/25/2012 Document Reviewed: 12/28/2011 [facility] Patient Information 2015 [place], [place]. This information is not intended to replace advice given to you by your health care provider. Make sure you discuss any questions you have with your health care provider. Breastfeeding and Mastitis Mastitis is inflammation of the breast tissue. It can occur in women who are breastfeeding. This can make breastfeeding painful. Mastitis will sometimes go away on its own. Your health care provider will help determine if treatment is needed. CAUSES Mastitis is often associated with a blocked milk (lactiferous) duct. This can happen when too much milk builds up in the breast. Causes of excess milk in the breast can include:  Poor latch-on. If your baby is not latched onto the breast properly, she or he may not empty your breast completely while breastfeeding.  Allowing too much time to pass between feedings.  Wearing a bra or other clothing that is too tight. This puts extra pressure on the lactiferous ducts so milk does not flow through them as it should. Mastitis can also be caused by a bacterial infection. Bacteria may enter the breast tissue through cuts or openings in the skin. In women who are breastfeeding, this may occur because of cracked or irritated skin. Cracks in the skin are often caused when your baby does not latch on properly to the breast. SIGNS AND SYMPTOMS  Swelling, redness, tenderness, and pain in an area of the breast.  Swelling of the glands under the arm on the same side.  Fever may or may not accompany mastitis. If an infection is allowed to progress, a collection of pus (abscess) may develop. DIAGNOSIS  Your health care provider can usually diagnose mastitis based on your symptoms and a physical exam. Tests may be done to help confirm the diagnosis. These may include:  Removal of pus from the breast by applying pressure to the area. This pus can be examined in the lab to  determine which bacteria are present. If an abscess has developed, the fluid in the abscess can be removed with a needle. This can also be used to confirm the diagnosis and determine the bacteria present. In most cases, pus will not be present.  Blood tests to determine if your body is fighting a bacterial infection.  Mammogram or ultrasound tests to rule out other problems or diseases. TREATMENT  Mastitis that occurs with breastfeeding will sometimes go away on its own. Your health care provider may choose to wait 24 hours after first seeing you to decide whether a prescription medicine is needed. If your symptoms are worse after 24 hours, your health care provider will likely prescribe an antibiotic medicine to treat the mastitis. He or she will determine which bacteria are most likely causing the infection and will then select an appropriate antibiotic medicine. This is sometimes changed based on the results of tests performed to identify the bacteria, or if there is no response to the antibiotic medicine selected. Antibiotic medicines are usually given by mouth. You  may also be given medicine for pain. HOME CARE INSTRUCTIONS  Only take over-the-counter or prescription medicines for pain, fever, or discomfort as directed by your health care provider.  If your health care provider prescribed an antibiotic medicine, take the medicine as directed. Make sure you finish it even if you start to feel better.  Do not wear a tight or underwire bra. Wear a soft, supportive bra.  Increase your fluid intake, especially if you have a fever.  Continue to empty the breast. Your health care provider can tell you whether this milk is safe for your infant or needs to be thrown out. You may be told to stop nursing until your health care provider thinks it is safe for your baby. Use a breast pump if you are advised to stop nursing.  Keep your nipples clean and dry.  Empty the first breast completely before going  to the other breast. If your baby is not emptying your breasts completely for some reason, use a breast pump to empty your breasts.  If you go back to work, pump your breasts while at work to stay in time with your nursing schedule.  Avoid allowing your breasts to become overly filled with milk (engorged). SEEK MEDICAL CARE IF:  You have pus-like discharge from the breast.  Your symptoms do not improve with the treatment prescribed by your health care provider within 2 days. SEEK IMMEDIATE MEDICAL CARE IF:  Your pain and swelling are getting worse.  You have pain that is not controlled with medicine.  You have a red line extending from the breast toward your armpit.  You have a fever or persistent symptoms for more than 2-3 days.  You have a fever and your symptoms suddenly get worse. MAKE SURE YOU:   Understand these instructions.  Will watch your condition.  Will get help right away if you are not doing well or get worse. Document Released: 08/27/2004 Document Revised: 05/07/2013 Document Reviewed: 12/06/2012 [facility] Patient Information 2015 [place], [place]. This information is not intended to replace advice given to you by your health care provider. Make sure you discuss any questions you have with your health care provider. Breastfeeding Deciding to breastfeed is one of the best choices you can make for you and your baby. A change in hormones during pregnancy causes your breast tissue to grow and increases the number and size of your milk ducts. These hormones also allow proteins, sugars, and fats from your blood supply to make breast milk in your milk-producing glands. Hormones prevent breast milk from being released before your baby is born as well as prompt milk flow after birth. Once breastfeeding has begun, thoughts of your baby, as well as his or her sucking or crying, can stimulate the release of milk from your milk-producing glands.  BENEFITS OF BREASTFEEDING For Your  Baby  Your first milk (colostrum) helps your baby's digestive system function better.   There are antibodies in your milk that help your baby fight off infections.   Your baby has a lower incidence of asthma, allergies, and sudden infant death syndrome.   The nutrients in breast milk are better for your baby than infant formulas and are designed uniquely for your baby's needs.   Breast milk improves your baby's brain development.   Your baby is less likely to develop other conditions, such as childhood obesity, asthma, or type 2 diabetes mellitus.  For You   Breastfeeding helps to create a very special bond between you and  your baby.   Breastfeeding is convenient. Breast milk is always available at the correct temperature and costs nothing.   Breastfeeding helps to burn calories and helps you lose the weight gained during pregnancy.   Breastfeeding makes your uterus contract to its prepregnancy size faster and slows bleeding (lochia) after you give birth.   Breastfeeding helps to lower your risk of developing type 2 diabetes mellitus, osteoporosis, and breast or ovarian cancer later in life. SIGNS THAT YOUR BABY IS HUNGRY Early Signs of Hunger  Increased alertness or activity.  Stretching.  Movement of the head from side to side.  Movement of the head and opening of the mouth when the corner of the mouth or cheek is stroked (rooting).  Increased sucking sounds, smacking lips, cooing, sighing, or squeaking.  Hand-to-mouth movements.  Increased sucking of fingers or hands. Late Signs of Hunger  Fussing.  Intermittent crying. Extreme Signs of Hunger Signs of extreme hunger will require calming and consoling before your baby will be able to breastfeed successfully. Do not wait for the following signs of extreme hunger to occur before you initiate breastfeeding:   Restlessness.  A loud, strong cry.   Screaming. BREASTFEEDING BASICS Breastfeeding  Initiation  Find a comfortable place to sit or lie down, with your neck and back well supported.  Place a pillow or rolled up blanket under your baby to bring him or her to the level of your breast (if you are seated). Nursing pillows are specially designed to help support your arms and your baby while you breastfeed.  Make sure that your baby's abdomen is facing your abdomen.   Gently massage your breast. With your fingertips, massage from your chest wall toward your nipple in a circular motion. This encourages milk flow. You may need to continue this action during the feeding if your milk flows slowly.  Support your breast with 4 fingers underneath and your thumb above your nipple. Make sure your fingers are well away from your nipple and your baby's mouth.   Stroke your baby's lips gently with your finger or nipple.   When your baby's mouth is open wide enough, quickly bring your baby to your breast, placing your entire nipple and as much of the colored area around your nipple (areola) as possible into your baby's mouth.   More areola should be visible above your baby's upper lip than below the lower lip.   Your baby's tongue should be between his or her lower gum and your breast.   Ensure that your baby's mouth is correctly positioned around your nipple (latched). Your baby's lips should create a seal on your breast and be turned out (everted).  It is common for your baby to suck about 2-3 minutes in order to start the flow of breast milk. Latching Teaching your baby how to latch on to your breast properly is very important. An improper latch can cause nipple pain and decreased milk supply for you and poor weight gain in your baby. Also, if your baby is not latched onto your nipple properly, he or she may swallow some air during feeding. This can make your baby fussy. Burping your baby when you switch breasts during the feeding can help to get rid of the air. However, teaching your  baby to latch on properly is still the best way to prevent fussiness from swallowing air while breastfeeding. Signs that your baby has successfully latched on to your nipple:    Silent tugging or silent sucking, without  causing you pain.   Swallowing heard between every 3-4 sucks.    Muscle movement above and in front of his or her ears while sucking.  Signs that your baby has not successfully latched on to nipple:   Sucking sounds or smacking sounds from your baby while breastfeeding.  Nipple pain. If you think your baby has not latched on correctly, slip your finger into the corner of your baby's mouth to break the suction and place it between your baby's gums. Attempt breastfeeding initiation again. Signs of Successful Breastfeeding Signs from your baby:   A gradual decrease in the number of sucks or complete cessation of sucking.   Falling asleep.   Relaxation of his or her body.   Retention of a small amount of milk in his or her mouth.   Letting go of your breast by himself or herself. Signs from you:  Breasts that have increased in firmness, weight, and size 1-3 hours after feeding.   Breasts that are softer immediately after breastfeeding.  Increased milk volume, as well as a change in milk consistency and color by the fifth day of breastfeeding.   Nipples that are not sore, cracked, or bleeding. Signs That Your [employer]el Books is Getting Enough Milk  Wetting at least 3 diapers in a 24-hour period. The urine should be clear and pale yellow by age 13 days.  At least 3 stools in a 24-hour period by age 13 days. The stool should be soft and yellow.  At least 3 stools in a 24-hour period by age 58 days. The stool should be seedy and yellow.  No loss of weight greater than 10% of birth weight during the first 44 days of age.  Average weight gain of 4-7 ounces (113-198 g) per week after age 1 days.  Consistent daily weight gain by age 80 days, without weight loss after the  age of 2 weeks. After a feeding, your baby may spit up a small amount. This is common. BREASTFEEDING FREQUENCY AND DURATION Frequent feeding will help you make more milk and can prevent sore nipples and breast engorgement. Breastfeed when you feel the need to reduce the fullness of your breasts or when your baby shows signs of hunger. This is called "breastfeeding on demand." Avoid introducing a pacifier to your baby while you are working to establish breastfeeding (the first 4-6 weeks after your baby is born). After this time you may choose to use a pacifier. Research has shown that pacifier use during the first year of a baby's life decreases the risk of sudden infant death syndrome (SIDS). Allow your baby to feed on each breast as long as he or she wants. Breastfeed until your baby is finished feeding. When your baby unlatches or falls asleep while feeding from the first breast, offer the second breast. Because newborns are often sleepy in the first few weeks of life, you may need to awaken your baby to get him or her to feed. Breastfeeding times will vary from baby to baby. However, the following rules can serve as a guide to help you ensure that your baby is properly fed:  Newborns (babies 72 weeks of age or younger) may breastfeed every 1-3 hours.  Newborns should not go longer than 3 hours during the day or 5 hours during the night without breastfeeding.  You should breastfeed your baby a minimum of 8 times in a 24-hour period until you begin to introduce solid foods to your baby at around 6 months of  age. BREAST MILK PUMPING Pumping and storing breast milk allows you to ensure that your baby is exclusively fed your breast milk, even at times when you are unable to breastfeed. This is especially important if you are going back to work while you are still breastfeeding or when you are not able to be present during feedings. Your lactation consultant can give you guidelines on how long it is safe to  store breast milk.  A breast pump is a machine that allows you to pump milk from your breast into a sterile bottle. The pumped breast milk can then be stored in a refrigerator or freezer. Some breast pumps are operated by hand, while others use electricity. Ask your lactation consultant which type will work best for you. Breast pumps can be purchased, but some hospitals and breastfeeding support groups lease breast pumps on a monthly basis. A lactation consultant can teach you how to hand express breast milk, if you prefer not to use a pump.  CARING FOR YOUR BREASTS WHILE YOU BREASTFEED Nipples can become dry, cracked, and sore while breastfeeding. The following recommendations can help keep your breasts moisturized and healthy:  Avoid using soap on your nipples.   Wear a supportive bra. Although not required, special nursing bras and tank tops are designed to allow access to your breasts for breastfeeding without taking off your entire bra or top. Avoid wearing underwire-style bras or extremely tight bras.  Air dry your nipples for 3-35minutes after each feeding.   Use only cotton bra pads to absorb leaked breast milk. Leaking of breast milk between feedings is normal.   Use lanolin on your nipples after breastfeeding. Lanolin helps to maintain your skin's normal moisture barrier. If you use pure lanolin, you do not need to wash it off before feeding your baby again. Pure lanolin is not toxic to your baby. You may also hand express a few drops of breast milk and gently massage that milk into your nipples and allow the milk to air dry. In the first few weeks after giving birth, some women experience extremely full breasts (engorgement). Engorgement can make your breasts feel heavy, warm, and tender to the touch. Engorgement peaks within 3-5 days after you give birth. The following recommendations can help ease engorgement:  Completely empty your breasts while breastfeeding or pumping. You may want  to start by applying warm, moist heat (in the shower or with warm water-soaked hand towels) just before feeding or pumping. This increases circulation and helps the milk flow. If your baby does not completely empty your breasts while breastfeeding, pump any extra milk after he or she is finished.  Wear a snug bra (nursing or regular) or tank top for 1-2 days to signal your body to slightly decrease milk production.  Apply ice packs to your breasts, unless this is too uncomfortable for you.  Make sure that your baby is latched on and positioned properly while breastfeeding. If engorgement persists after 48 hours of following these recommendations, contact your health care provider or a Science writer. OVERALL HEALTH CARE RECOMMENDATIONS WHILE BREASTFEEDING  Eat healthy foods. Alternate between meals and snacks, eating 3 of each per day. Because what you eat affects your breast milk, some of the foods may make your baby more irritable than usual. Avoid eating these foods if you are sure that they are negatively affecting your baby.  Drink milk, fruit juice, and water to satisfy your thirst (about 10 glasses a day).   Rest often, relax,  and continue to take your prenatal vitamins to prevent fatigue, stress, and anemia.  Continue breast self-awareness checks.  Avoid chewing and smoking tobacco.  Avoid alcohol and drug use. Some medicines that may be harmful to your baby can pass through breast milk. It is important to ask your health care provider before taking any medicine, including all over-the-counter and prescription medicine as well as vitamin and herbal supplements. It is possible to become pregnant while breastfeeding. If birth control is desired, ask your health care provider about options that will be safe for your baby. SEEK MEDICAL CARE IF:   You feel like you want to stop breastfeeding or have become frustrated with breastfeeding.  You have painful breasts or  nipples.  Your nipples are cracked or bleeding.  Your breasts are red, tender, or warm.  You have a swollen area on either breast.  You have a fever or chills.  You have nausea or vomiting.  You have drainage other than breast milk from your nipples.  Your breasts do not become full before feedings by the fifth day after you give birth.  You feel sad and depressed.  Your baby is too sleepy to eat well.  Your baby is having trouble sleeping.   Your baby is wetting less than 3 diapers in a 24-hour period.  Your baby has less than 3 stools in a 24-hour period.  Your baby's skin or the white part of his or her eyes becomes yellow.   Your baby is not gaining weight by 54 days of age. SEEK IMMEDIATE MEDICAL CARE IF:   Your baby is overly tired (lethargic) and does not want to wake up and feed.  Your baby develops an unexplained fever. Document Released: 05/02/2005 Document Revised: 05/07/2013 Document Reviewed: 10/24/2012 [facility] Patient Information 2015 [place], [place]. This information is not intended to replace advice given to you by your health care provider. Make sure you discuss any questions you have with your health care provider.

## 2014-12-03 NOTE — Progress Notes (Signed)
PPD #2 - VAVD with 2nd Degree Laceration  Subjective:   Reports feeling well Tolerating po/ No nausea or vomiting Bleeding is light Pain controlled with Motrin Up ad lib / ambulatory / voiding without problems Newborn: breastfeeding  / Circumcision: NOT planning   Objective:   VS:  VS:  Filed Vitals:   12/01/14 1645 12/01/14 2115 12/02/14 1844 12/03/14 0530  BP: 94/50 105/54 95/55 92/55   Pulse: 64 60 65 55  Temp: 98.2 F (36.8 C) 98.5 F (36.9 C) 97.7 F (36.5 C) 98.2 F (36.8 C)  TempSrc: Oral Oral Oral   Resp: 18 16 17 18   Height:      Weight:      SpO2:  98%      LABS:   Recent Labs  12/02/14 0535  WBC 14.3*  HGB 10.6*  PLT 129*   Blood type: O NEG (07/19 0535) / Infant Rh POS - Rhophylac given 7/19 Rubella: Immune (12/18 0000)    Physical Exam: Alert and oriented x3 Abdomen: soft, non-tender, non-distended  Fundus: firm, non-tender, U-2 Perineum: Well approximated, no significant erythema, edema, or drainage; healing well. Lochia: small Extremities: no edema, no calf pain or tenderness    Assessment:  PPD #2 / G1P1001 / S/P:spontaneous vaginal, 2nd degree laceration A1GDM, delivered  Rh negative-baby Rh pos  Doing well    Plan: Continue routine post partum orders D/C home today  Laury Deep, M MSN, CNM 12/03/2014, 8:56 AM

## 2014-12-03 NOTE — Discharge Summary (Signed)
Obstetric Discharge Summary Reason for Admission: induction of labor for [facility] Prenatal Procedures: none Intrapartum Course: Admitted for IOL for GDM / cervical balloon, AROM and pitocin for induction / epidural for pain management / normal progression to complete dilation / VAVD of viable female with 2nd degree repair by Dr. Benjie Karvonen / no immediate postpartum complications noted. Intrapartum Procedures: vacuum Postpartum Procedures: none Complications-Operative and Postpartum: 2nd degree perineal laceration HEMOGLOBIN  Date Value Ref Range Status  12/02/2014 10.6* 12.0 - 15.0 g/dL Final   HCT  Date Value Ref Range Status  12/02/2014 31.3* 36.0 - 46.0 % Final    Physical Exam:  General: alert, cooperative and no distress Lochia: appropriate Uterine Fundus: firm Perineum: healing well, no significant drainage, no dehiscence, no significant erythema DVT Evaluation: No evidence of DVT seen on physical exam. Negative Homan's sign. No cords or calf tenderness. No significant calf/ankle edema.  Discharge Diagnoses: Term Pregnancy-delivered  Discharge Information: Date: 12/03/2014 Activity: pelvic rest Diet: routine Medications: PNV and Ibuprofen Condition: stable Instructions: refer to practice specific booklet Discharge to: home Follow-up Information    Follow up with MODY,VAISHALI R, MD. Schedule an appointment as soon as possible for a visit in 6 weeks.   Specialty:  Obstetrics and Gynecology   Why:  postpartum visit   Contact information:   [place] [address] [phone]       Newborn Data: Live born female on 12/01/2014 Birth Weight: 7 lb 5.1 oz (3320 g) APGAR: 9, 9  Home with mother.  Laury Deep, M MSN, CNM 12/03/2014, 9:02 AM

## 2014-12-04 LAB — TYPE AND SCREEN
ABO/RH(D): O NEG
ANTIBODY SCREEN: POSITIVE
DAT, IgG: NEGATIVE
Unit division: 0
Unit division: 0

## 2014-12-18 ENCOUNTER — Encounter (HOSPITAL_COMMUNITY): Payer: Self-pay | Admitting: Obstetrics & Gynecology

## 2014-12-18 DIAGNOSIS — Q270 Congenital absence and hypoplasia of umbilical artery: Secondary | ICD-10-CM | POA: Insufficient documentation

## 2014-12-18 DIAGNOSIS — O411290 Chorioamnionitis, unspecified trimester, not applicable or unspecified: Secondary | ICD-10-CM | POA: Insufficient documentation

## 2015-01-24 DIAGNOSIS — J4530 Mild persistent asthma, uncomplicated: Secondary | ICD-10-CM

## 2015-01-24 DIAGNOSIS — J309 Allergic rhinitis, unspecified: Secondary | ICD-10-CM

## 2015-02-19 ENCOUNTER — Telehealth: Payer: Self-pay | Admitting: Neurology

## 2015-02-19 NOTE — Telephone Encounter (Signed)
Notified patient and documented in the book as well.

## 2015-02-19 NOTE — Telephone Encounter (Signed)
Please give 0.1 cc red vial with next injection. Notify patient.

## 2015-02-19 NOTE — Telephone Encounter (Signed)
PT came in for injection today 02/19/15 but her last injection was on 11-14-14 she is on her Red vial number 4 and she got 0.20 on 11-14-14. Please advised next dose to give patient. Notified patient we would speak with Dr Neldon Mc and notify her back once we heard back.

## 2015-02-20 ENCOUNTER — Other Ambulatory Visit: Payer: Self-pay | Admitting: *Deleted

## 2015-02-20 MED ORDER — MONTELUKAST SODIUM 10 MG PO TABS: 10.0000 mg | ORAL_TABLET | Freq: Every day | ORAL | Status: DC

## 2015-02-20 NOTE — Telephone Encounter (Signed)
Refilled montelukast 10mg  x 1 [place] outpatient.

## 2015-02-27 ENCOUNTER — Ambulatory Visit (INDEPENDENT_AMBULATORY_CARE_PROVIDER_SITE_OTHER): Payer: 59 | Admitting: Neurology

## 2015-02-27 DIAGNOSIS — J309 Allergic rhinitis, unspecified: Secondary | ICD-10-CM | POA: Diagnosis not present

## 2015-03-06 ENCOUNTER — Ambulatory Visit (INDEPENDENT_AMBULATORY_CARE_PROVIDER_SITE_OTHER): Payer: 59 | Admitting: Neurology

## 2015-03-06 DIAGNOSIS — J309 Allergic rhinitis, unspecified: Secondary | ICD-10-CM

## 2015-03-13 ENCOUNTER — Ambulatory Visit (INDEPENDENT_AMBULATORY_CARE_PROVIDER_SITE_OTHER): Payer: 59

## 2015-03-13 DIAGNOSIS — J301 Allergic rhinitis due to pollen: Secondary | ICD-10-CM

## 2015-03-17 ENCOUNTER — Ambulatory Visit (INDEPENDENT_AMBULATORY_CARE_PROVIDER_SITE_OTHER): Payer: 59 | Admitting: Allergy and Immunology

## 2015-03-17 ENCOUNTER — Encounter: Payer: Self-pay | Admitting: Allergy and Immunology

## 2015-03-17 VITALS — BP 112/80 | HR 76 | Temp 98.2°F | Resp 18

## 2015-03-17 DIAGNOSIS — J328 Other chronic sinusitis: Secondary | ICD-10-CM | POA: Diagnosis not present

## 2015-03-17 DIAGNOSIS — J3089 Other allergic rhinitis: Secondary | ICD-10-CM

## 2015-03-17 DIAGNOSIS — J4530 Mild persistent asthma, uncomplicated: Secondary | ICD-10-CM | POA: Diagnosis not present

## 2015-03-17 MED ORDER — ALBUTEROL SULFATE HFA 108 (90 BASE) MCG/ACT IN AERS: 2.0000 | INHALATION_SPRAY | Freq: Four times a day (QID) | RESPIRATORY_TRACT | Status: DC | PRN

## 2015-03-17 MED ORDER — MONTELUKAST SODIUM 10 MG PO TABS: 10.0000 mg | ORAL_TABLET | Freq: Every day | ORAL | Status: DC

## 2015-03-17 MED ORDER — BUDESONIDE 180 MCG/ACT IN AEPB: 2.0000 | INHALATION_SPRAY | Freq: Every day | RESPIRATORY_TRACT | Status: DC

## 2015-03-17 NOTE — Progress Notes (Signed)
Michelle Alvarez  Follow-up Note  Refering Provider: Jackolyn Confer, MD Primary Provider: Rica Mast, MD  Subjective:   Michelle Alvarez is a 34 y.o. female who returns to the [place] in re-evaluation of the following:  HPI Comments:  Michelle Alvarez returns to this clinic in reevaluation of her asthma and allergic rhinitis. I last saw her in his clinic while she was pregnant in December 2015. She delivered in July and did relatively well up until about 10 days ago or so. At that point in time she got a scratchy throat and a sore throat and nasal congestion and complete anosmia and some clear to yellow nasal discharge along with cough. She is not had to use a short-acting bronchodilator. She did restart her immunotherapy possibly 3 weeks ago. She continues to use her Pulmicort 2 inhalations one time per day and over-the-counter Rhinocort one spray shot also once a day and montelukast 10 mg daily. She is breast-feeding.   Outpatient Encounter Prescriptions as of 03/17/2015  Medication Sig  . albuterol (PROVENTIL HFA;VENTOLIN HFA) 108 (90 BASE) MCG/ACT inhaler Inhale 2 puffs into the lungs every 6 (six) hours as needed.  . budesonide (PULMICORT) 180 MCG/ACT inhaler Inhale 2 puffs into the lungs at bedtime.  . budesonide (RHINOCORT ALLERGY) 32 MCG/ACT nasal spray Place 1 spray into both nostrils daily.  Marland Kitchen EPINEPHrine (EPIPEN 2-PAK) 0.3 mg/0.3 mL IJ SOAJ injection Inject into the muscle as needed (INJECTION PROTOCOL).  Marland Kitchen ibuprofen (ADVIL,MOTRIN) 600 MG tablet Take 1 tablet (600 mg total) by mouth every 6 (six) hours.  . montelukast (SINGULAIR) 10 MG tablet Take 1 tablet (10 mg total) by mouth at bedtime.  . Prenatal Vit-Fe Fumarate-FA (PRENATAL MULTIVITAMIN) TABS tablet Take 1 tablet by mouth daily at 12 noon.  . mometasone (ASMANEX) 220 MCG/INH inhaler Inhale 2 puffs into the lungs daily. (Patient not taking:  Reported on 03/17/2015)   No facility-administered encounter medications on file as of 03/17/2015.    No orders of the defined types were placed in this encounter.    Past Medical History  Diagnosis Date  . Deviated nasal septum 04/2012  . Chronic sinusitis 04/2012  . Cough 05/14/2012  . Stuffy and runny nose 05/14/2012    clear drainage from nose  . Hx of varicella   . Asthma   . 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 Gestational diabetes     diet controlled  . Postpartum care following vaginal delivery (7/18) 12/01/2014    Past Surgical History  Procedure Laterality Date  . Wisdom tooth extraction    . Arthroscopic repair acl      bilateral  . Knee arthroscopy      right  . Sinus endo w/fusion  05/21/2012    Procedure: ENDOSCOPIC SINUS SURGERY WITH FUSION NAVIGATION;  Surgeon: Jerrell Belfast, MD;  Location: [place];  Service: ENT;  Laterality: Bilateral;  BILATERAL ENDOSCOPIC SINUS SURGERY  WITH FUSION SCAN   . Nasal septoplasty w/ turbinoplasty  05/21/2012    Procedure: NASAL SEPTOPLASTY WITH TURBINATE REDUCTION;  Surgeon: Jerrell Belfast, MD;  Location: [facility];  Service: ENT;  Laterality: Bilateral;  NASAL SEPTOPLATY AND BILATERAL INFERIOR TURBINATE REDUCTION     Allergies  Allergen Reactions  . Cephalexin Rash  . Doxycycline Rash  . Penicillins Rash  . Vancomycin Itching    Review of Systems  Constitutional: Negative for fever and chills.  HENT:  Positive for congestion, sneezing and sore throat. Negative for ear pain, facial swelling, nosebleeds, postnasal drip, rhinorrhea, sinus pressure, tinnitus, trouble swallowing and voice change.   Eyes: Negative for pain, discharge, redness and itching.  Respiratory: Positive for cough. Negative for choking, chest tightness, shortness of breath, wheezing and stridor.   Cardiovascular: Negative for chest pain and leg swelling.  Gastrointestinal: Negative for nausea, vomiting and  abdominal pain.  Musculoskeletal: Negative for myalgias and arthralgias.  Allergic/Immunologic: Negative.   Neurological: Negative for dizziness.  Hematological: Negative for adenopathy.     Objective:   Filed Vitals:   03/17/15 1701  BP: 112/80  Pulse: 76  Temp: 98.2 F (36.8 C)  Resp: 18          Physical Exam  Constitutional: She appears well-developed and well-nourished. No distress.  Nasal voice  HENT:  Head: Normocephalic and atraumatic. Head is without right periorbital erythema and without left periorbital erythema.  Right Ear: Tympanic membrane, external ear and ear canal normal. No drainage or tenderness. No foreign bodies. Tympanic membrane is not injected, not scarred, not perforated, not erythematous, not retracted and not bulging. No middle ear effusion.  Left Ear: Tympanic membrane, external ear and ear canal normal. No drainage or tenderness. No foreign bodies. Tympanic membrane is not injected, not scarred, not perforated, not erythematous, not retracted and not bulging.  No middle ear effusion.  Nose: Mucosal edema present. No rhinorrhea, nose lacerations or sinus tenderness.  No foreign bodies.  Mouth/Throat: Oropharynx is clear and moist. No oropharyngeal exudate, posterior oropharyngeal edema, posterior oropharyngeal erythema or tonsillar abscesses.  Eyes: Lids are normal. Right eye exhibits no chemosis, no discharge and no exudate. No foreign body present in the right eye. Left eye exhibits no chemosis, no discharge and no exudate. No foreign body present in the left eye. Right conjunctiva is not injected. Left conjunctiva is not injected.  Neck: Neck supple. No tracheal tenderness present. No tracheal deviation and no edema present. No thyroid mass and no thyromegaly present.  Cardiovascular: Normal rate, regular rhythm, S1 normal and S2 normal.  Exam reveals no gallop.   No murmur heard. Pulmonary/Chest: No accessory muscle usage or stridor. No respiratory  distress. She has no wheezes. She has no rhonchi. She has no rales.  Abdominal: Soft. There is no hepatosplenomegaly. There is no tenderness. There is no rigidity, no rebound and no guarding.  Lymphadenopathy:       Head (right side): No tonsillar adenopathy present.       Head (left side): No tonsillar adenopathy present.    She has no cervical adenopathy.  Neurological: She is alert.  Skin: No rash noted. She is not diaphoretic.  Psychiatric: She has a normal mood and affect. Her behavior is normal.    Diagnostics:    Spirometry was performed and demonstrated an FEV1 of 3.55 at 114 % of predicted.  The patient had an Asthma Control Test with the following results: ACT Total Score: 24.    Assessment and Plan:   1. Other allergic rhinitis   2. Other chronic sinusitis   3. Mild persistent asthma, uncomplicated      1. Nasal saline several times per day  2. Prednisone 10mg  one tablet one time per day for seven days  3. Continue Pulmicort 180 two inhalations one time per day. Increase to 3 inhalations three times per day during 'flare up'  4. Continue OTC Rhinocort one spray each nostril one time per day (coupon)  5. Continue montelukast  10mg  one tablet one time per day  6. Continue Proair Hfa and antihistamine if needed.  7. Continue immunotherapy and Epi-Pen  8. When better get a flu vaccine  9. Antibiotics?  10. Return in one year or earlier if problem   I will assume that [facility] contracted a rhinovirus infection and will not require antibiotics at this point in time. She does have a history of persistent sinusitis/chronic sinusitis and if she does not clear up over the course of the next 10-14 days which she may require a course of antibiotics. I will see her back in this clinic in approximately one year or earlier if there is a problem.   [name], MD [place]

## 2015-03-17 NOTE — Patient Instructions (Signed)
  1. Nasal saline several times per day  2. Prednisone 10mg  one tablet one time per day for seven days  3. Continue Pulmicort 180 two inhalations one time per day. Increase to 3 inhalations three times per day during 'flare up'  4. Continue OTC Rhinocort one spray each nostril one time per day (coupon)  5. Continue montelukast 10mg  one tablet one time per day  6. Continue Proair Hfa and antihistamine if needed.  7. Continue immunotherapy and Epi-Pen  8. When better get a flu vaccine  9. Antibiotics?  Return in one year or earlier if problem

## 2015-04-03 ENCOUNTER — Ambulatory Visit (INDEPENDENT_AMBULATORY_CARE_PROVIDER_SITE_OTHER): Payer: 59

## 2015-04-03 DIAGNOSIS — J309 Allergic rhinitis, unspecified: Secondary | ICD-10-CM | POA: Diagnosis not present

## 2015-04-17 ENCOUNTER — Ambulatory Visit (INDEPENDENT_AMBULATORY_CARE_PROVIDER_SITE_OTHER): Payer: 59 | Admitting: *Deleted

## 2015-04-17 DIAGNOSIS — J309 Allergic rhinitis, unspecified: Secondary | ICD-10-CM

## 2015-05-22 DIAGNOSIS — J3089 Other allergic rhinitis: Secondary | ICD-10-CM | POA: Diagnosis not present

## 2015-06-19 ENCOUNTER — Ambulatory Visit (INDEPENDENT_AMBULATORY_CARE_PROVIDER_SITE_OTHER): Payer: 59 | Admitting: Neurology

## 2015-06-19 DIAGNOSIS — J309 Allergic rhinitis, unspecified: Secondary | ICD-10-CM

## 2015-06-23 MED FILL — PULMICORT 180 MCG FLEXHALER: 180 | 60 days supply | Qty: 1 | Fill #0 | Status: TO

## 2015-06-26 ENCOUNTER — Ambulatory Visit (INDEPENDENT_AMBULATORY_CARE_PROVIDER_SITE_OTHER): Payer: 59 | Admitting: Neurology

## 2015-06-26 DIAGNOSIS — J309 Allergic rhinitis, unspecified: Secondary | ICD-10-CM

## 2015-06-30 MED FILL — MONTELUKAST SOD 10 MG TAB: 10 | 90 days supply | Qty: 90 | Fill #0 | Status: TO

## 2015-07-03 ENCOUNTER — Ambulatory Visit (INDEPENDENT_AMBULATORY_CARE_PROVIDER_SITE_OTHER): Payer: 59 | Admitting: Neurology

## 2015-07-03 DIAGNOSIS — J309 Allergic rhinitis, unspecified: Secondary | ICD-10-CM

## 2015-07-17 ENCOUNTER — Ambulatory Visit (INDEPENDENT_AMBULATORY_CARE_PROVIDER_SITE_OTHER): Payer: 59 | Admitting: *Deleted

## 2015-07-17 DIAGNOSIS — J309 Allergic rhinitis, unspecified: Secondary | ICD-10-CM | POA: Diagnosis not present

## 2015-07-31 ENCOUNTER — Ambulatory Visit (INDEPENDENT_AMBULATORY_CARE_PROVIDER_SITE_OTHER): Payer: 59 | Admitting: *Deleted

## 2015-07-31 DIAGNOSIS — J309 Allergic rhinitis, unspecified: Secondary | ICD-10-CM | POA: Diagnosis not present

## 2015-08-21 ENCOUNTER — Ambulatory Visit (INDEPENDENT_AMBULATORY_CARE_PROVIDER_SITE_OTHER): Payer: 59 | Admitting: *Deleted

## 2015-08-21 DIAGNOSIS — J309 Allergic rhinitis, unspecified: Secondary | ICD-10-CM | POA: Diagnosis not present

## 2015-08-25 MED FILL — PULMICORT 180 MCG FLEXHALER: 180 | 60 days supply | Qty: 1 | Fill #0 | Status: TO

## 2015-09-04 ENCOUNTER — Ambulatory Visit (INDEPENDENT_AMBULATORY_CARE_PROVIDER_SITE_OTHER): Payer: 59 | Admitting: *Deleted

## 2015-09-04 DIAGNOSIS — J309 Allergic rhinitis, unspecified: Secondary | ICD-10-CM | POA: Diagnosis not present

## 2015-09-08 ENCOUNTER — Telehealth: Payer: Self-pay | Admitting: Family Medicine

## 2015-09-08 NOTE — Telephone Encounter (Signed)
Pt would like to trans care to Dr Birdie Riddle. Please advise ok to schedule.

## 2015-09-08 NOTE — Telephone Encounter (Signed)
I have not seen her since 2014. Fine to transfer care

## 2015-09-09 NOTE — Telephone Encounter (Signed)
Pt has been scheduled for NP appt on 11/18/15.

## 2015-09-09 NOTE — Telephone Encounter (Signed)
Ok to transfer care.  

## 2015-09-24 ENCOUNTER — Other Ambulatory Visit: Payer: Self-pay | Admitting: Allergy and Immunology

## 2015-09-24 MED FILL — MONTELUKAST SOD 10 MG TAB: 10 | 90 days supply | Qty: 90 | Fill #0

## 2015-10-23 MED FILL — PULMICORT 180 MCG FLEXHALER: 180 | 30 days supply | Qty: 1 | Fill #0

## 2015-11-18 ENCOUNTER — Ambulatory Visit (INDEPENDENT_AMBULATORY_CARE_PROVIDER_SITE_OTHER): Payer: 59 | Admitting: Family Medicine

## 2015-11-18 ENCOUNTER — Encounter: Payer: Self-pay | Admitting: Family Medicine

## 2015-11-18 VITALS — BP 115/85 | HR 76 | Temp 98.0°F | Resp 17 | Ht 67.0 in | Wt 125.4 lb

## 2015-11-18 DIAGNOSIS — Z0000 Encounter for general adult medical examination without abnormal findings: Secondary | ICD-10-CM | POA: Diagnosis not present

## 2015-11-18 NOTE — Progress Notes (Signed)
   Subjective:    Patient ID: Michelle Alvarez, female    DOB: [birth year], 35 y.o.   MRN: [id number]  HPI New to establish.  Previous MD- Gilford Rile  GYN- [facility]  Allergy- [place]  CPE- no concerns.  UTD on pap.  UTD on Tdap (Feb 2017)   Review of Systems Patient reports no vision/ hearing changes, adenopathy,fever, weight change,  persistant/recurrent hoarseness , swallowing issues, chest pain, palpitations, edema, persistant/recurrent cough, hemoptysis, dyspnea (rest/exertional/paroxysmal nocturnal), gastrointestinal bleeding (melena, rectal bleeding), abdominal pain, significant heartburn, bowel changes, GU symptoms (dysuria, hematuria, incontinence), Gyn symptoms (abnormal  bleeding, pain),  syncope, focal weakness, memory loss, numbness & tingling, skin/hair/nail changes, abnormal bruising or bleeding, anxiety, or depression.     Objective:   Physical Exam General Appearance:    Alert, cooperative, no distress, appears stated age  Head:    Normocephalic, without obvious abnormality, atraumatic  Eyes:    PERRL, conjunctiva/corneas clear, EOM's intact, fundi    benign, both eyes  Ears:    Normal TM's and external ear canals, both ears  Nose:   Nares normal, septum midline, mucosa normal, no drainage    or sinus tenderness  Throat:   Lips, mucosa, and tongue normal; teeth and gums normal  Neck:   Supple, symmetrical, trachea midline, no adenopathy;    Thyroid: no enlargement/tenderness/nodules  Back:     Symmetric, no curvature, ROM normal, no CVA tenderness  Lungs:     Clear to auscultation bilaterally, respirations unlabored  Chest Wall:    No tenderness or deformity   Heart:    Regular rate and rhythm, S1 and S2 normal, no murmur, rub   or gallop  Breast Exam:    Deferred to GYN  Abdomen:     Soft, non-tender, bowel sounds active all four quadrants,    no masses, no organomegaly  Genitalia:    Deferred to GYN  Rectal:    Extremities:   Extremities normal, atraumatic, no cyanosis or  edema  Pulses:   2+ and symmetric all extremities  Skin:   Skin color, texture, turgor normal, no rashes or lesions  Lymph nodes:   Cervical, supraclavicular, and axillary nodes normal  Neurologic:   CNII-XII intact, normal strength, sensation and reflexes    throughout          Assessment & Plan:

## 2015-11-18 NOTE — Progress Notes (Signed)
Pre visit review using our clinic review tool, if applicable. No additional management support is needed unless otherwise documented below in the visit note. 

## 2015-11-18 NOTE — Assessment & Plan Note (Signed)
Pt's PE WNL.  UTD on GYN.  Pt to have fasting labs done at another time/location.  UTD on Tdap.  Anticipatory guidance provided.

## 2015-11-18 NOTE — Patient Instructions (Signed)
Follow up in 1 year or as needed Schedule a lab visit at the [facility] office Keep up the good work on healthy diet and regular exercise- you look great! Call with any questions or concerns Welcome!  We're glad to have you!!!

## 2015-12-08 ENCOUNTER — Other Ambulatory Visit (INDEPENDENT_AMBULATORY_CARE_PROVIDER_SITE_OTHER): Payer: 59

## 2015-12-08 DIAGNOSIS — Z0000 Encounter for general adult medical examination without abnormal findings: Secondary | ICD-10-CM | POA: Diagnosis not present

## 2015-12-08 LAB — HEPATIC FUNCTION PANEL
ALK PHOS: 44 U/L (ref 39–117)
ALT: 11 U/L (ref 0–35)
AST: 13 U/L (ref 0–37)
Albumin: 4.5 g/dL (ref 3.5–5.2)
BILIRUBIN DIRECT: 0.3 mg/dL (ref 0.0–0.3)
Total Bilirubin: 1.8 mg/dL — ABNORMAL HIGH (ref 0.2–1.2)
Total Protein: 6.9 g/dL (ref 6.0–8.3)

## 2015-12-08 LAB — BASIC METABOLIC PANEL
BUN: 18 mg/dL (ref 6–23)
CHLORIDE: 102 meq/L (ref 96–112)
CO2: 29 mEq/L (ref 19–32)
Calcium: 9.6 mg/dL (ref 8.4–10.5)
Creatinine, Ser: 0.77 mg/dL (ref 0.40–1.20)
GFR: 91.13 mL/min (ref >60.00)
Glucose, Bld: 83 mg/dL (ref 70–99)
POTASSIUM: 3.8 meq/L (ref 3.5–5.1)
Sodium: 138 mEq/L (ref 135–145)

## 2015-12-08 LAB — CBC WITH DIFFERENTIAL/PLATELET
Basophils Absolute: 0 10*3/uL (ref 0.0–0.1)
Basophils Relative: 0.4 % (ref 0.0–3.0)
EOS PCT: 2.5 % (ref 0.0–5.0)
Eosinophils Absolute: 0.1 10*3/uL (ref 0.0–0.7)
HCT: 40.5 % (ref 36.0–46.0)
Hemoglobin: 13.5 g/dL (ref 12.0–15.0)
LYMPHS ABS: 2.2 10*3/uL (ref 0.7–4.0)
Lymphocytes Relative: 38.1 % (ref 12.0–46.0)
MCHC: 33.3 g/dL (ref 30.0–36.0)
MCV: 89.1 fl (ref 78.0–100.0)
MONO ABS: 0.4 10*3/uL (ref 0.1–1.0)
Monocytes Relative: 7.1 % (ref 3.0–12.0)
NEUTROS ABS: 2.9 10*3/uL (ref 1.4–7.7)
NEUTROS PCT: 51.9 % (ref 43.0–77.0)
PLATELETS: 178 10*3/uL (ref 150.0–400.0)
RBC: 4.54 Mil/uL (ref 3.87–5.11)
RDW: 14.2 % (ref 11.5–15.5)
WBC: 5.7 10*3/uL (ref 4.0–10.5)

## 2015-12-08 LAB — LIPID PANEL
CHOL/HDL RATIO: 2
CHOLESTEROL: 177 mg/dL (ref 0–200)
HDL: 88.5 mg/dL (ref >39.00)
LDL Cholesterol: 78 mg/dL (ref 0–99)
NonHDL: 88.83
Triglycerides: 55 mg/dL (ref 0.0–149.0)
VLDL: 11 mg/dL (ref 0.0–40.0)

## 2015-12-08 LAB — TSH: TSH: 1.93 u[IU]/mL (ref 0.35–4.50)

## 2015-12-08 LAB — VITAMIN D 25 HYDROXY (VIT D DEFICIENCY, FRACTURES): VITD: 47.46 ng/mL (ref 30.00–100.00)

## 2015-12-18 ENCOUNTER — Other Ambulatory Visit: Payer: Self-pay | Admitting: Allergy and Immunology

## 2015-12-18 MED FILL — PULMICORT 180 MCG FLEXHALER: 180 | 30 days supply | Qty: 1 | Fill #1

## 2015-12-18 MED FILL — MONTELUKAST SOD 10 MG TAB: 10 | 90 days supply | Qty: 90 | Fill #0

## 2016-02-08 MED FILL — PULMICORT 180 MCG FLEXHALER: 180 | 30 days supply | Qty: 1 | Fill #2

## 2016-03-03 LAB — HM PAP SMEAR

## 2016-03-25 DIAGNOSIS — Z6820 Body mass index (BMI) 20.0-20.9, adult: Secondary | ICD-10-CM | POA: Diagnosis not present

## 2016-03-25 DIAGNOSIS — Z01419 Encounter for gynecological examination (general) (routine) without abnormal findings: Secondary | ICD-10-CM | POA: Diagnosis not present

## 2016-03-28 ENCOUNTER — Other Ambulatory Visit: Payer: Self-pay | Admitting: Allergy and Immunology

## 2016-03-28 MED FILL — MONTELUKAST SOD 10 MG TAB: 10 | 30 days supply | Qty: 30 | Fill #0

## 2016-03-31 ENCOUNTER — Other Ambulatory Visit: Payer: Self-pay | Admitting: Allergy and Immunology

## 2016-03-31 MED ORDER — BUDESONIDE 180 MCG/ACT IN AEPB: 2.0000 | INHALATION_SPRAY | Freq: Every day | RESPIRATORY_TRACT | 0 refills | Status: DC

## 2016-03-31 MED FILL — PULMICORT 180 MCG FLEXHALER: 180 | 30 days supply | Qty: 1 | Fill #0

## 2016-03-31 NOTE — Telephone Encounter (Signed)
Pulmicort refill sent to pharmacy.

## 2016-03-31 NOTE — Telephone Encounter (Signed)
Patient last saw Dr. Neldon Mc on 03-17-15. She called today and made an appointment for 04-12-16 because she needs her medicine refilled. She asked if she could get Pulmicort called in to get her through to her appointment. She said she has about a week left. She uses [facility].

## 2016-04-12 ENCOUNTER — Encounter: Payer: Self-pay | Admitting: Allergy and Immunology

## 2016-04-12 ENCOUNTER — Ambulatory Visit (INDEPENDENT_AMBULATORY_CARE_PROVIDER_SITE_OTHER): Payer: 59 | Admitting: Allergy and Immunology

## 2016-04-12 VITALS — BP 98/68 | HR 64 | Resp 16

## 2016-04-12 DIAGNOSIS — J4530 Mild persistent asthma, uncomplicated: Secondary | ICD-10-CM

## 2016-04-12 DIAGNOSIS — J3089 Other allergic rhinitis: Secondary | ICD-10-CM | POA: Diagnosis not present

## 2016-04-12 NOTE — Progress Notes (Signed)
Follow-up Note  Referring Provider: Midge Minium, MD Primary Provider: Annye Asa, MD Date of Office Visit: 04/12/2016  Subjective:   Michelle Alvarez (DOB: 09-Mar-1981) is a 35 y.o. female who returns to the Allergy and [place] on 04/12/2016 in re-evaluation of the following:  HPI:  Michelle Alvarez returns to this clinic in evaluation of her asthma and allergic rhinitis. I last saw her in his clinic November 2016.  During the interval she has had excellent control of her asthma and has not required a systemic steroid to treat an exacerbation and can exercise without any difficulty and rarely uses a short acting bronchodilator while she continues to use Pulmicort and montelukast.  Other than contracting a few head colds she's had very good control of her upper airway disease were using Rhinocort. She's not required an antibiotic to treat an episode of sinusitis.  She had to discontinue her immunotherapy because of the logistical issue.  She did receive the flu vaccine this year.    Medication List      albuterol 108 (90 Base) MCG/ACT inhaler Commonly known as:  PROVENTIL HFA;VENTOLIN HFA Inhale 2 puffs into the lungs every 6 (six) hours as needed.   budesonide 180 MCG/ACT inhaler Commonly known as:  PULMICORT Inhale 2 puffs into the lungs at bedtime.   EPIPEN 2-PAK 0.3 mg/0.3 mL Soaj injection Generic drug:  EPINEPHrine Inject into the muscle as needed (INJECTION PROTOCOL). Reported on 11/18/2015   ibuprofen 600 MG tablet Commonly known as:  ADVIL,MOTRIN Take 1 tablet (600 mg total) by mouth every 6 (six) hours.   montelukast 10 MG tablet Commonly known as:  SINGULAIR TAKE 1 TABLET BY MOUTH AT BEDTIME.   prenatal multivitamin Tabs tablet Take 1 tablet by mouth daily at 12 noon.   RHINOCORT ALLERGY 32 MCG/ACT nasal spray Generic drug:  budesonide Place 1 spray into both nostrils daily.       Past Medical History:  Diagnosis Date  . Asthma     . Chronic sinusitis 04/2012  . Cough 05/14/2012  . Deviated nasal septum 04/2012  . Gestational diabetes    diet controlled  . Hx of varicella   . Postpartum care following vaginal delivery (7/18) 12/01/2014  . 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 Stuffy and runny nose 05/14/2012   clear drainage from nose    Past Surgical History:  Procedure Laterality Date  . ARTHROSCOPIC REPAIR ACL     bilateral  . KNEE ARTHROSCOPY     right  . NASAL SEPTOPLASTY W/ TURBINOPLASTY  05/21/2012   Procedure: NASAL SEPTOPLASTY WITH TURBINATE REDUCTION;  Surgeon: Jerrell Belfast, MD;  Location: [facility];  Service: ENT;  Laterality: Bilateral;  NASAL SEPTOPLATY AND BILATERAL INFERIOR TURBINATE REDUCTION   . SINUS ENDO W/FUSION  05/21/2012   Procedure: ENDOSCOPIC SINUS SURGERY WITH FUSION NAVIGATION;  Surgeon: Jerrell Belfast, MD;  Location: [facility];  Service: ENT;  Laterality: Bilateral;  BILATERAL ENDOSCOPIC SINUS SURGERY  WITH FUSION SCAN   . WISDOM TOOTH EXTRACTION      Allergies  Allergen Reactions  . Cephalexin Rash  . Doxycycline Rash  . Penicillins Rash  . Vancomycin Itching    Review of systems negative except as noted in HPI / PMHx or noted below:  Review of Systems  Constitutional: Negative.   HENT: Negative.   Eyes: Negative.   Respiratory: Negative.   Cardiovascular: Negative.   Gastrointestinal: Negative.   Genitourinary: Negative.  Musculoskeletal: Negative.   Skin: Negative.   Neurological: Negative.   Endo/Heme/Allergies: Negative.   Psychiatric/Behavioral: Negative.      Objective:   Vitals:   04/12/16 1738  BP: 98/68  Pulse: 64  Resp: 16          Physical Exam  Constitutional: She is well-developed, well-nourished, and in no distress.  HENT:  Head: Normocephalic.  Right Ear: Tympanic membrane, external ear and ear canal normal.  Left Ear: Tympanic membrane, external ear and ear canal normal.  Nose: Nose normal.  No mucosal edema or rhinorrhea.  Mouth/Throat: Uvula is midline, oropharynx is clear and moist and mucous membranes are normal. No oropharyngeal exudate.  Eyes: Conjunctivae are normal.  Neck: Trachea normal. No tracheal tenderness present. No tracheal deviation present. No thyromegaly present.  Cardiovascular: Normal rate, regular rhythm, S1 normal, S2 normal and normal heart sounds.   No murmur heard. Pulmonary/Chest: Breath sounds normal. No stridor. No respiratory distress. She has no wheezes. She has no rales.  Musculoskeletal: She exhibits no edema.  Lymphadenopathy:       Head (right side): No tonsillar adenopathy present.       Head (left side): No tonsillar adenopathy present.    She has no cervical adenopathy.  Neurological: She is alert. Gait normal.  Skin: No rash noted. She is not diaphoretic. No erythema. Nails show no clubbing.  Psychiatric: Mood and affect normal.    Diagnostics:    Spirometry was performed and demonstrated an FEV1 of 3.44 at 109 % of predicted.  Assessment and Plan:   1. Mild persistent asthma, uncomplicated   2. Other allergic rhinitis     1. Continue Pulmicort 180 two inhalations one time per day. Increase to 3 inhalations three times per day during 'flare up'  2. Continue OTC Rhinocort one spray each nostril one time per day (coupon)  3. Continue montelukast 10mg  one tablet one time per day  4. Continue Proair Hfa and antihistamine if needed.  5. Return to clinic in 1 year or earlier if problem   [name] has really done very well on her current medical therapy and we'll continue to have her use Pulmicort and Rhinocort and montelukast and she will return to this clinic in 1 year or earlier if there is a problem.  [name], MD [place]

## 2016-04-12 NOTE — Patient Instructions (Addendum)
  1. Continue Pulmicort 180 two inhalations one time per day. Increase to 3 inhalations three times per day during 'flare up'  2. Continue OTC Rhinocort one spray each nostril one time per day (coupon)  3. Continue montelukast 10mg  one tablet one time per day  4. Continue Proair Hfa and antihistamine if needed.  5. Return to clinic in 1 year or earlier if problem

## 2016-04-13 MED ORDER — BUDESONIDE 180 MCG/ACT IN AEPB: 2.0000 | INHALATION_SPRAY | Freq: Two times a day (BID) | RESPIRATORY_TRACT | 3 refills | Status: DC

## 2016-04-13 MED ORDER — MONTELUKAST SODIUM 10 MG PO TABS: 10.0000 mg | ORAL_TABLET | Freq: Every day | ORAL | 3 refills | Status: DC

## 2016-04-13 MED ORDER — ALBUTEROL SULFATE HFA 108 (90 BASE) MCG/ACT IN AERS: 2.0000 | INHALATION_SPRAY | Freq: Four times a day (QID) | RESPIRATORY_TRACT | 1 refills | Status: DC | PRN

## 2016-05-03 MED FILL — MONTELUKAST SOD 10 MG TAB: 10 | 90 days supply | Qty: 90 | Fill #0 | Status: TO

## 2016-05-03 MED FILL — PULMICORT 180 MCG FLEXHALER: 180 | 30 days supply | Qty: 1 | Fill #0 | Status: TO

## 2016-06-23 NOTE — Addendum Note (Signed)
Addended by: Felipa Emory on: 06/23/2016 11:46 AM   Modules accepted: Orders

## 2016-08-02 MED FILL — PULMICORT 180 MCG FLEXHALER: 180 | 30 days supply | Qty: 1 | Fill #0 | Status: TO

## 2016-08-02 MED FILL — MONTELUKAST SOD 10 MG TAB: 10 | 90 days supply | Qty: 90 | Fill #0

## 2016-09-23 MED FILL — PULMICORT 180 MCG FLEXHALER: 180 | 30 days supply | Qty: 1 | Fill #0 | Status: TO

## 2016-10-31 MED FILL — MONTELUKAST SOD 10 MG TAB: 10 | 90 days supply | Qty: 90 | Fill #1

## 2016-11-23 MED FILL — PULMICORT 180 MCG FLEXHALER: 180 | 30 days supply | Qty: 1 | Fill #0

## 2016-11-25 DIAGNOSIS — Z3149 Encounter for other procreative investigation and testing: Secondary | ICD-10-CM | POA: Diagnosis not present

## 2016-12-07 DIAGNOSIS — Z319 Encounter for procreative management, unspecified: Secondary | ICD-10-CM | POA: Diagnosis not present

## 2017-01-23 MED FILL — PULMICORT 180 MCG FLEXHALER: 180 | 30 days supply | Qty: 1 | Fill #1

## 2017-01-23 MED FILL — MONTELUKAST SOD 10 MG TAB: 10 | 90 days supply | Qty: 90 | Fill #2

## 2017-02-16 DIAGNOSIS — Z3201 Encounter for pregnancy test, result positive: Secondary | ICD-10-CM | POA: Diagnosis not present

## 2017-02-17 ENCOUNTER — Encounter: Payer: Self-pay | Admitting: Allergy and Immunology

## 2017-02-24 ENCOUNTER — Encounter: Payer: 59 | Admitting: Family Medicine

## 2017-03-03 ENCOUNTER — Encounter: Payer: Self-pay | Admitting: Family Medicine

## 2017-03-03 ENCOUNTER — Ambulatory Visit (INDEPENDENT_AMBULATORY_CARE_PROVIDER_SITE_OTHER): Payer: 59 | Admitting: Family Medicine

## 2017-03-03 VITALS — BP 100/76 | HR 73 | Temp 98.0°F | Resp 16 | Ht 67.0 in | Wt 138.4 lb

## 2017-03-03 DIAGNOSIS — Z0000 Encounter for general adult medical examination without abnormal findings: Secondary | ICD-10-CM | POA: Diagnosis not present

## 2017-03-03 NOTE — Progress Notes (Signed)
   Subjective:    Patient ID: Michelle Alvarez, female    DOB: Mar 17, 1981, 36 y.o.   MRN: [id number]  HPI CPE- UTD on Tdap, flu.  UTD on GYN (currently pregnant).  No concerns.   Review of Systems Patient reports no vision/ hearing changes, adenopathy,fever, weight change,  persistant/recurrent hoarseness , swallowing issues, chest pain, palpitations, edema, persistant/recurrent cough, hemoptysis, dyspnea (rest/exertional/paroxysmal nocturnal), gastrointestinal bleeding (melena, rectal bleeding), abdominal pain, significant heartburn, bowel changes, GU symptoms (dysuria, hematuria, incontinence), Gyn symptoms (abnormal  bleeding, pain),  syncope, focal weakness, memory loss, numbness & tingling, skin/hair/nail changes, abnormal bruising or bleeding, anxiety, or depression.     Objective:   Physical Exam General Appearance:    Alert, cooperative, no distress, appears stated age  Head:    Normocephalic, without obvious abnormality, atraumatic  Eyes:    PERRL, conjunctiva/corneas clear, EOM's intact, fundi    benign, both eyes  Ears:    Normal TM's and external ear canals, both ears  Nose:   Nares normal, septum midline, mucosa normal, no drainage    or sinus tenderness  Throat:   Lips, mucosa, and tongue normal; teeth and gums normal  Neck:   Supple, symmetrical, trachea midline, no adenopathy;    Thyroid: no enlargement/tenderness/nodules  Back:     Symmetric, no curvature, ROM normal, no CVA tenderness  Lungs:     Clear to auscultation bilaterally, respirations unlabored  Chest Wall:    No tenderness or deformity   Heart:    Regular rate and rhythm, S1 and S2 normal, no murmur, rub   or gallop  Breast Exam:    Deferred to GYN  Abdomen:     Soft, non-tender, bowel sounds active all four quadrants,    no masses, no organomegaly  Genitalia:    Deferred to GYN  Rectal:    Extremities:   Extremities normal, atraumatic, no cyanosis or edema  Pulses:   2+ and symmetric all extremities    Skin:   Skin color, texture, turgor normal, no rashes or lesions  Lymph nodes:   Cervical, supraclavicular, and axillary nodes normal  Neurologic:   CNII-XII intact, normal strength, sensation and reflexes    throughout          Assessment & Plan:

## 2017-03-03 NOTE — Assessment & Plan Note (Signed)
Pt's PE WNL.  UTD on immunizations, GYN.  No need for labs as pt is following w/ OB for pregnancy.  Anticipatory guidance provided.

## 2017-03-03 NOTE — Patient Instructions (Signed)
Follow up in 1 year or as needed There is no need for labs- you look great! Keep up the good work!  You look great!!! Call with any questions or concerns CONGRATS!!!!

## 2017-03-10 DIAGNOSIS — Z3201 Encounter for pregnancy test, result positive: Secondary | ICD-10-CM | POA: Diagnosis not present

## 2017-03-22 DIAGNOSIS — Z3689 Encounter for other specified antenatal screening: Secondary | ICD-10-CM | POA: Diagnosis not present

## 2017-03-22 DIAGNOSIS — O09521 Supervision of elderly multigravida, first trimester: Secondary | ICD-10-CM | POA: Diagnosis not present

## 2017-03-22 LAB — OB RESULTS CONSOLE HIV ANTIBODY (ROUTINE TESTING): HIV: NONREACTIVE

## 2017-03-22 LAB — OB RESULTS CONSOLE HEPATITIS B SURFACE ANTIGEN: HEP B S AG: NEGATIVE

## 2017-03-22 LAB — OB RESULTS CONSOLE RUBELLA ANTIBODY, IGM: RUBELLA: IMMUNE

## 2017-03-22 LAB — OB RESULTS CONSOLE RPR: RPR: NONREACTIVE

## 2017-03-22 MED FILL — ONDANSETRON HCL 4 MG TABLET: 4 | 6 days supply | Qty: 20 | Fill #0

## 2017-03-28 ENCOUNTER — Ambulatory Visit (INDEPENDENT_AMBULATORY_CARE_PROVIDER_SITE_OTHER): Payer: 59 | Admitting: Allergy and Immunology

## 2017-03-28 VITALS — BP 108/60 | HR 64 | Resp 16 | Ht 66.0 in | Wt 137.0 lb

## 2017-03-28 DIAGNOSIS — J4530 Mild persistent asthma, uncomplicated: Secondary | ICD-10-CM | POA: Diagnosis not present

## 2017-03-28 DIAGNOSIS — J3089 Other allergic rhinitis: Secondary | ICD-10-CM | POA: Diagnosis not present

## 2017-03-28 MED ORDER — BUDESONIDE 180 MCG/ACT IN AEPB: 2.0000 | INHALATION_SPRAY | Freq: Two times a day (BID) | RESPIRATORY_TRACT | 1 refills | Status: DC

## 2017-03-28 MED ORDER — ALBUTEROL SULFATE HFA 108 (90 BASE) MCG/ACT IN AERS
2.0000 | INHALATION_SPRAY | RESPIRATORY_TRACT | 1 refills | Status: DC | PRN
Start: 2017-03-28 — End: 2018-03-06

## 2017-03-28 MED FILL — PULMICORT 180 MCG FLEXHALER: 180 | 30 days supply | Qty: 1 | Fill #0

## 2017-03-28 NOTE — Patient Instructions (Addendum)
  1. Continue Pulmicort 180 two inhalations one time per day. Increase to 3 inhalations three times per day during 'flare up'  2. Continue OTC Rhinocort one spray each nostril one time per day during periods of upper airway symptoms  3. Continue Proair Hfa and antihistamine if needed.  4. Return to clinic in 1 year or earlier if problem

## 2017-03-28 NOTE — Progress Notes (Signed)
Follow-up Note  Referring Provider: Midge Minium, MD Primary Provider: Midge Minium, MD Date of Office Visit: 03/28/2017  Subjective:   Michelle Alvarez (DOB: 1980/11/21) is a 36 y.o. female who returns to the Allergy and [place] on 03/28/2017 in re-evaluation of the following:  HPI: Michelle Alvarez presents to this clinic in reevaluation of her asthma and allergic rhinitis.  Her last visit to this clinic was 12 April 2016.    Michelle Alvarez has excellent control of her asthma while consistently using Pulmicort.  Rarely does she use a short acting bronchodilator and she can exercise without any difficulty.  She has not required a systemic steroid to treat an exacerbation.  Likewise she has very little problem with her upper airways.  Rarely does she use a nasal steroid.  She has not required a antibiotic to treat any type of respiratory tract infection.  She is now [redacted] weeks pregnant.  She discontinued her Singulair about 5 weeks ago and has done well.  She only continues on Pulmicort at this point.  She did receive the flu vaccine.  Allergies as of 03/28/2017      Reactions   Cephalexin Rash   Doxycycline Rash   Penicillins Rash   Vancomycin Itching      Medication List      albuterol 108 (90 Base) MCG/ACT inhaler Commonly known as:  PROVENTIL HFA;VENTOLIN HFA Inhale 2 puffs every 4 (four) hours as needed into the lungs.   budesonide 180 MCG/ACT inhaler Commonly known as:  PULMICORT Inhale 2 puffs 2 (two) times daily into the lungs.   loratadine 10 MG tablet Commonly known as:  CLARITIN Take 10 mg daily by mouth.   ondansetron 4 MG tablet Commonly known as:  ZOFRAN   prenatal multivitamin Tabs tablet Take 1 tablet by mouth daily at 12 noon.       Past Medical History:  Diagnosis Date  . Asthma   . Chronic sinusitis 04/2012  . Cough 05/14/2012  . Deviated nasal septum 04/2012  . Gestational diabetes    diet controlled  . Hx of varicella     . Postpartum care following vaginal delivery (7/18) 12/01/2014  . 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 Stuffy and runny nose 05/14/2012   clear drainage from nose    Past Surgical History:  Procedure Laterality Date  . ARTHROSCOPIC REPAIR ACL     bilateral  . KNEE ARTHROSCOPY     right  . WISDOM TOOTH EXTRACTION      Review of systems negative except as noted in HPI / PMHx or noted below:  Review of Systems  Constitutional: Negative.   HENT: Negative.   Eyes: Negative.   Respiratory: Negative.   Cardiovascular: Negative.   Gastrointestinal: Negative.   Genitourinary: Negative.   Musculoskeletal: Negative.   Skin: Negative.   Neurological: Negative.   Endo/Heme/Allergies: Negative.   Psychiatric/Behavioral: Negative.      Objective:   Vitals:   03/28/17 1736  BP: 108/60  Pulse: 64  Resp: 16  SpO2: 99%   Height: 5\' 6"  (167.6 cm)  Weight: 137 lb (62.1 kg)   Physical Exam  Constitutional: She is well-developed, well-nourished, and in no distress.  HENT:  Head: Normocephalic.  Right Ear: Tympanic membrane, external ear and ear canal normal.  Left Ear: Tympanic membrane, external ear and ear canal normal.  Nose: Nose normal. No mucosal edema or rhinorrhea.  Mouth/Throat: Uvula is midline, oropharynx is clear and moist  and mucous membranes are normal. No oropharyngeal exudate.  Eyes: Conjunctivae are normal.  Neck: Trachea normal. No tracheal tenderness present. No tracheal deviation present. No thyromegaly present.  Cardiovascular: Normal rate, regular rhythm, S1 normal, S2 normal and normal heart sounds.  No murmur heard. Pulmonary/Chest: Breath sounds normal. No stridor. No respiratory distress. She has no wheezes. She has no rales.  Musculoskeletal: She exhibits no edema.  Lymphadenopathy:       Head (right side): No tonsillar adenopathy present.       Head (left side): No tonsillar adenopathy present.    She has no cervical adenopathy.   Neurological: She is alert. Gait normal.  Skin: No rash noted. She is not diaphoretic. No erythema. Nails show no clubbing.  Psychiatric: Mood and affect normal.    Diagnostics:    Spirometry was performed and demonstrated an FEV1 of 3.63 at 107 % of predicted.  The patient had an Asthma Control Test with the following results: ACT Total Score: 25.    Assessment and Plan:   1. Asthma, well controlled, mild persistent   2. Other allergic rhinitis     1. Continue Pulmicort 180 two inhalations one time per day. Increase to 3 inhalations three times per day during 'flare up'  2. Continue OTC Rhinocort one spray each nostril one time per day during periods of upper airway symptoms  3. Continue Proair Hfa and antihistamine if needed.  4. Return to clinic in 1 year or earlier if problem   Michelle Alvarez appears to be doing very well on her current plan and we will continue to have her use Pulmicort and she has the option of using Rhinocort should she develop significant upper airway symptoms and I will assume she will do well throughout her pregnancy and beyond and see her back in his clinic in 1 year or earlier if there is a problem.  [name], MD Allergy / Immunology [place]

## 2017-03-29 ENCOUNTER — Encounter: Payer: Self-pay | Admitting: Allergy and Immunology

## 2017-03-29 MED FILL — VENTOLIN HFA 90 MCG INHALER: 108 (90 BAS | 25 days supply | Qty: 18 | Fill #0

## 2017-05-03 DIAGNOSIS — Z361 Encounter for antenatal screening for raised alphafetoprotein level: Secondary | ICD-10-CM | POA: Diagnosis not present

## 2017-05-15 ENCOUNTER — Encounter: Payer: Self-pay | Admitting: Family Medicine

## 2017-05-15 DIAGNOSIS — Z3A18 18 weeks gestation of pregnancy: Secondary | ICD-10-CM

## 2017-05-16 NOTE — L&D Delivery Note (Signed)
Delivery Note At  a viable and healthy female was delivered over intact perineum via svd (Presentation: ; LOA ).  APGAR:9/9 ; weight  not yet done.   Placenta status: delivered,spontaneously and intact .  Cord:  3 vv, with the following complications: none  Anesthesia:  epidural Episiotomy:  none Lacerations:  second degree; left vaginal Suture Repair: 3.0 vicryl; hemostatic after repair Est. Blood Loss (mL):  271ml  Mom to postpartum.  Baby to Couplet care / Skin to Skin.  Charyl Bigger 10/08/2017, 9:53 PM

## 2017-06-05 MED FILL — PULMICORT 180 MCG FLEXHALER: 180 | 30 days supply | Qty: 1 | Fill #1

## 2017-06-19 ENCOUNTER — Other Ambulatory Visit: Payer: Self-pay

## 2017-06-19 NOTE — Patient Outreach (Signed)
[facility]) Care Management  06/19/2017  Michelle Alvarez 1980-05-25 [id number]   Telephone call for assessment of needs due to referral for gestational diabetes. No answer and message left. Plan to attempt 2nd outreach 06/20/16 if call not returned.  Peter Garter RN, [facility] Care Management Coordinator [facility] Care Management [phone]

## 2017-06-20 ENCOUNTER — Other Ambulatory Visit: Payer: Self-pay

## 2017-06-20 NOTE — Patient Outreach (Signed)
[facility]) Care Management  06/20/2017  Michelle Alvarez 1981-01-31 [id number]   Telephone call for assessment of needs due to referral for gestational diabetes. Second attempt. Member returned call.  States that she had gestational diabetes with her last pregnancy.  States she has not had her oral glucose tolerance test yet.  States she thinks she will have in 3-4 weeks.  States she did a good job of keeping her blood sugars in range with her last pregnancy.    Instructed member to call RNCM when she knows the results of her oral glucose tolerance test.  Instructed on benefits that will be provided if she is active with [facility].   Plan to call for follow up in one month if member does not contact RNCM  Successful outreach letter to be sent with [facility] contact information.   Peter Garter RN, [facility] Care Management Coordinator [facility] Care Management [phone]

## 2017-07-21 ENCOUNTER — Ambulatory Visit: Payer: Self-pay

## 2017-07-21 ENCOUNTER — Telehealth: Payer: Self-pay | Admitting: *Deleted

## 2017-07-21 MED FILL — FREESTYLE LITE METER: 20 days supply | Qty: 1 | Fill #0

## 2017-07-21 MED FILL — FREESTYLE LANCETS: 25 days supply | Qty: 100 | Fill #0

## 2017-07-21 MED FILL — FREESTYLE LITE TEST STRIP: 25 days supply | Qty: 100 | Fill #0

## 2017-07-21 NOTE — Patient Outreach (Signed)
Left message on [facility]'s mobile number requesting return call to discuss results of glucose screening to rule out gestational diabetes. Await return call. Barrington Ellison RN,CCM,CDE [facility] Management Coordinator Office Phone [phone] Office Fax [phone]

## 2017-08-13 MED FILL — PULMICORT 180 MCG FLEXHALER: 180 | 30 days supply | Qty: 1 | Fill #2

## 2017-08-17 MED FILL — FREESTYLE LANCETS: 25 days supply | Qty: 100 | Fill #1

## 2017-08-17 MED FILL — FREESTYLE LITE TEST STRIP: 25 days supply | Qty: 100 | Fill #1

## 2017-08-21 ENCOUNTER — Other Ambulatory Visit: Payer: Self-pay | Admitting: *Deleted

## 2017-08-21 NOTE — Patient Outreach (Signed)
[facility]) Care Management  08/21/2017  Michelle Alvarez 1980/11/07 [id number]   [name] has not returned calls requesting results of glucose tolerance test to determine if she needs enrollment in the [place] gestational diabetes program. Will close case since her due date is 10/18/17 and she would have completed the glucose tolerance test with knowledge of results.   Barrington Ellison RN,CCM,CDE [place] Management Coordinator Office Phone [phone] Office Fax [phone]

## 2017-09-12 LAB — OB RESULTS CONSOLE GBS: GBS: POSITIVE

## 2017-10-08 ENCOUNTER — Inpatient Hospital Stay (HOSPITAL_COMMUNITY): Payer: No Typology Code available for payment source | Admitting: Anesthesiology

## 2017-10-08 ENCOUNTER — Other Ambulatory Visit: Payer: Self-pay

## 2017-10-08 ENCOUNTER — Encounter (HOSPITAL_COMMUNITY): Payer: Self-pay

## 2017-10-08 ENCOUNTER — Inpatient Hospital Stay (HOSPITAL_COMMUNITY)
Admission: AD | Admit: 2017-10-08 | Discharge: 2017-10-10 | DRG: 807 | Disposition: A | Discharge status: Home / Self Care | Payer: No Typology Code available for payment source | Source: Ambulatory Visit | Attending: Obstetrics and Gynecology | Admitting: Obstetrics and Gynecology

## 2017-10-08 DIAGNOSIS — O99824 Streptococcus B carrier state complicating childbirth: Secondary | ICD-10-CM | POA: Diagnosis present

## 2017-10-08 DIAGNOSIS — O26899 Other specified pregnancy related conditions, unspecified trimester: Secondary | ICD-10-CM

## 2017-10-08 DIAGNOSIS — O24420 Gestational diabetes mellitus in childbirth, diet controlled: Secondary | ICD-10-CM | POA: Diagnosis present

## 2017-10-08 DIAGNOSIS — Z6791 Unspecified blood type, Rh negative: Secondary | ICD-10-CM

## 2017-10-08 DIAGNOSIS — Z3A38 38 weeks gestation of pregnancy: Secondary | ICD-10-CM

## 2017-10-08 DIAGNOSIS — O9952 Diseases of the respiratory system complicating childbirth: Secondary | ICD-10-CM | POA: Diagnosis present

## 2017-10-08 DIAGNOSIS — J45909 Unspecified asthma, uncomplicated: Secondary | ICD-10-CM | POA: Diagnosis present

## 2017-10-08 DIAGNOSIS — Z3490 Encounter for supervision of normal pregnancy, unspecified, unspecified trimester: Secondary | ICD-10-CM

## 2017-10-08 DIAGNOSIS — O26893 Other specified pregnancy related conditions, third trimester: Secondary | ICD-10-CM | POA: Diagnosis present

## 2017-10-08 DIAGNOSIS — Z3483 Encounter for supervision of other normal pregnancy, third trimester: Secondary | ICD-10-CM | POA: Diagnosis present

## 2017-10-08 DIAGNOSIS — Z880 Allergy status to penicillin: Secondary | ICD-10-CM

## 2017-10-08 LAB — CBC
HCT: 35.7 % — ABNORMAL LOW (ref 36.0–46.0)
HEMOGLOBIN: 12.1 g/dL (ref 12.0–15.0)
MCH: 32.4 pg (ref 26.0–34.0)
MCHC: 33.9 g/dL (ref 30.0–36.0)
MCV: 95.5 fL (ref 78.0–100.0)
PLATELETS: 179 10*3/uL (ref 150–400)
RBC: 3.74 MIL/uL — AB (ref 3.87–5.11)
RDW: 13.6 % (ref 11.5–15.5)
WBC: 10.4 10*3/uL (ref 4.0–10.5)

## 2017-10-08 MED ORDER — PHENYLEPHRINE 40 MCG/ML (10ML) SYRINGE FOR IV PUSH (FOR BLOOD PRESSURE SUPPORT)
PREFILLED_SYRINGE | INTRAVENOUS | Status: AC
  Filled 2017-10-08: qty 10

## 2017-10-08 MED ORDER — ONDANSETRON HCL 4 MG/2ML IJ SOLN: 4.0000 mg | INTRAMUSCULAR | Status: DC | PRN

## 2017-10-08 MED ORDER — FENTANYL 2.5 MCG/ML BUPIVACAINE 1/10 % EPIDURAL INFUSION (WH - ANES)
INTRAMUSCULAR | Status: AC
  Filled 2017-10-08: qty 100

## 2017-10-08 MED ORDER — PHENYLEPHRINE 40 MCG/ML (10ML) SYRINGE FOR IV PUSH (FOR BLOOD PRESSURE SUPPORT)
80.0000 ug | PREFILLED_SYRINGE | INTRAVENOUS | Status: DC | PRN
  Filled 2017-10-08: qty 5

## 2017-10-08 MED ORDER — OXYTOCIN BOLUS FROM INFUSION
500.0000 mL | Freq: Once | INTRAVENOUS | Status: AC
  Administered 2017-10-08: 500 mL via INTRAVENOUS

## 2017-10-08 MED ORDER — ALBUTEROL SULFATE HFA 108 (90 BASE) MCG/ACT IN AERS: 2.0000 | INHALATION_SPRAY | RESPIRATORY_TRACT | Status: DC | PRN

## 2017-10-08 MED ORDER — CLINDAMYCIN PHOSPHATE 900 MG/50ML IV SOLN
900.0000 mg | Freq: Three times a day (TID) | INTRAVENOUS | Status: DC
  Administered 2017-10-08: 900 mg via INTRAVENOUS
  Filled 2017-10-08: qty 50

## 2017-10-08 MED ORDER — CLINDAMYCIN PHOSPHATE 900 MG/50ML IV SOLN
900.0000 mg | Freq: Three times a day (TID) | INTRAVENOUS | Status: DC
  Filled 2017-10-08: qty 50

## 2017-10-08 MED ORDER — LIDOCAINE HCL (PF) 1 % IJ SOLN
INTRAMUSCULAR | Status: DC | PRN
  Administered 2017-10-08 (×2): 5 mL via EPIDURAL

## 2017-10-08 MED ORDER — LACTATED RINGERS IV SOLN
INTRAVENOUS | Status: DC
  Administered 2017-10-08: 18:00:00 via INTRAVENOUS

## 2017-10-08 MED ORDER — DIPHENHYDRAMINE HCL 25 MG PO CAPS
25.0000 mg | ORAL_CAPSULE | Freq: Four times a day (QID) | ORAL | Status: DC | PRN
Start: 2017-10-08 — End: 2017-10-10

## 2017-10-08 MED ORDER — WITCH HAZEL-GLYCERIN EX PADS: 1.0000 "application " | MEDICATED_PAD | CUTANEOUS | Status: DC | PRN

## 2017-10-08 MED ORDER — LIDOCAINE HCL (PF) 1 % IJ SOLN
30.0000 mL | INTRAMUSCULAR | Status: DC | PRN
  Filled 2017-10-08: qty 30

## 2017-10-08 MED ORDER — BUDESONIDE 0.5 MG/2ML IN SUSP
0.5000 mg | Freq: Two times a day (BID) | RESPIRATORY_TRACT | Status: DC
  Filled 2017-10-08 (×4): qty 2

## 2017-10-08 MED ORDER — SOD CITRATE-CITRIC ACID 500-334 MG/5ML PO SOLN: 30.0000 mL | ORAL | Status: DC | PRN

## 2017-10-08 MED ORDER — ACETAMINOPHEN 325 MG PO TABS: 650.0000 mg | ORAL_TABLET | ORAL | Status: DC | PRN

## 2017-10-08 MED ORDER — OXYCODONE-ACETAMINOPHEN 5-325 MG PO TABS: 1.0000 | ORAL_TABLET | ORAL | Status: DC | PRN

## 2017-10-08 MED ORDER — TETANUS-DIPHTH-ACELL PERTUSSIS 5-2.5-18.5 LF-MCG/0.5 IM SUSP: 0.5000 mL | Freq: Once | INTRAMUSCULAR | Status: DC

## 2017-10-08 MED ORDER — DIPHENHYDRAMINE HCL 50 MG/ML IJ SOLN: 12.5000 mg | INTRAMUSCULAR | Status: DC | PRN

## 2017-10-08 MED ORDER — FENTANYL 2.5 MCG/ML BUPIVACAINE 1/10 % EPIDURAL INFUSION (WH - ANES)
14.0000 mL/h | INTRAMUSCULAR | Status: DC | PRN
  Administered 2017-10-08 (×2): 14 mL/h via EPIDURAL

## 2017-10-08 MED ORDER — SENNOSIDES-DOCUSATE SODIUM 8.6-50 MG PO TABS
2.0000 | ORAL_TABLET | ORAL | Status: DC
  Administered 2017-10-08 – 2017-10-10 (×2): 2 via ORAL
  Filled 2017-10-08 (×2): qty 2

## 2017-10-08 MED ORDER — ALBUTEROL SULFATE (2.5 MG/3ML) 0.083% IN NEBU: 3.0000 mL | INHALATION_SOLUTION | RESPIRATORY_TRACT | Status: DC | PRN

## 2017-10-08 MED ORDER — EPHEDRINE 5 MG/ML INJ
10.0000 mg | INTRAVENOUS | Status: DC | PRN
  Filled 2017-10-08: qty 2

## 2017-10-08 MED ORDER — LACTATED RINGERS IV SOLN
500.0000 mL | Freq: Once | INTRAVENOUS | Status: AC
  Administered 2017-10-08: 500 mL via INTRAVENOUS

## 2017-10-08 MED ORDER — ONDANSETRON HCL 4 MG/2ML IJ SOLN: 4.0000 mg | Freq: Four times a day (QID) | INTRAMUSCULAR | Status: DC | PRN

## 2017-10-08 MED ORDER — IBUPROFEN 600 MG PO TABS
600.0000 mg | ORAL_TABLET | Freq: Four times a day (QID) | ORAL | Status: DC
  Administered 2017-10-08 – 2017-10-10 (×6): 600 mg via ORAL
  Filled 2017-10-08 (×6): qty 1

## 2017-10-08 MED ORDER — BENZOCAINE-MENTHOL 20-0.5 % EX AERO
1.0000 "application " | INHALATION_SPRAY | CUTANEOUS | Status: DC | PRN
  Administered 2017-10-09: 1 via TOPICAL
  Filled 2017-10-08: qty 56

## 2017-10-08 MED ORDER — SIMETHICONE 80 MG PO CHEW: 80.0000 mg | CHEWABLE_TABLET | ORAL | Status: DC | PRN

## 2017-10-08 MED ORDER — OXYTOCIN 40 UNITS IN LACTATED RINGERS INFUSION - SIMPLE MED
2.5000 [IU]/h | INTRAVENOUS | Status: DC
  Filled 2017-10-08: qty 1000

## 2017-10-08 MED ORDER — DIBUCAINE 1 % RE OINT: 1.0000 "application " | TOPICAL_OINTMENT | RECTAL | Status: DC | PRN

## 2017-10-08 MED ORDER — COCONUT OIL OIL: 1.0000 "application " | TOPICAL_OIL | Status: DC | PRN

## 2017-10-08 MED ORDER — OXYCODONE-ACETAMINOPHEN 5-325 MG PO TABS: 2.0000 | ORAL_TABLET | ORAL | Status: DC | PRN

## 2017-10-08 MED ORDER — PRENATAL MULTIVITAMIN CH
1.0000 | ORAL_TABLET | Freq: Every day | ORAL | Status: DC
  Administered 2017-10-09: 1 via ORAL
  Filled 2017-10-08: qty 1

## 2017-10-08 MED ORDER — LACTATED RINGERS IV SOLN
500.0000 mL | INTRAVENOUS | Status: DC | PRN
Start: 2017-10-08 — End: 2017-10-08

## 2017-10-08 MED ORDER — ONDANSETRON HCL 4 MG PO TABS: 4.0000 mg | ORAL_TABLET | ORAL | Status: DC | PRN

## 2017-10-08 NOTE — Anesthesia Procedure Notes (Signed)
Epidural Patient location during procedure: OB Start time: 10/08/2017 7:46 PM End time: 10/08/2017 7:48 PM  Staffing Anesthesiologist: Lyn Hollingshead, MD Performed: anesthesiologist   Preanesthetic Checklist Completed: patient identified, site marked, surgical consent, pre-op evaluation, timeout performed, IV checked, risks and benefits discussed and monitors and equipment checked  Epidural Patient position: sitting Prep: site prepped and draped and DuraPrep Patient monitoring: continuous pulse ox and blood pressure Approach: midline Location: L3-L4 Injection technique: LOR air  Needle:  Needle type: Tuohy  Needle gauge: 17 G Needle length: 9 cm and 9 Needle insertion depth: 4 cm Catheter type: closed end flexible Catheter size: 19 Gauge Catheter at skin depth: 9 cm Test dose: negative and Other  Assessment Sensory level: T10 Events: blood not aspirated, injection not painful, no injection resistance, negative IV test and no paresthesia  Additional Notes Reason for block:procedure for pain

## 2017-10-08 NOTE — Progress Notes (Addendum)
G2P1 @ 38.[redacted] wksga. Here for contractions that started at 0315. Denies LOF or bleeding. +FM  EFM applied  VE 6/80/BB  GBS+ (PCN allergy)   Provider notified. Report given. Orders received to admit and place orders.   opened to epidural.   Clindamycin ordered for GBS+ status dt PCN allergy. Clin test verified in pt's chart.  Charge called to birthing.  1755: Pt to birthing room 163.

## 2017-10-08 NOTE — H&P (Signed)
Michelle Alvarez is a 37 y.o. G2P1001 at [redacted]w[redacted]d gestation presents for complaint of Contractions Since 0300 that have been increasingly more painful.  Denies any vaginal bleeding or LOF, +FM. Antepartum course: GDMA1, well controlled with diet; last growth u/s at 33 wga: efw 57% (4'10")  [facility] at [facility] since 10 wks.  See complete pre-natal records  History OB History    Gravida  2   Para  1   Term  1   Preterm      AB      Living  1     SAB      TAB      Ectopic      Multiple  0   Live Births  1          Past Medical History:  Diagnosis Date  . Asthma    PRN rescue inhaler   . Chronic sinusitis 04/2012  . Cough 05/14/2012  . Deviated nasal septum 04/2012  . Gestational diabetes    diet controlled/ self BS checks  . Hx of varicella   . Postpartum care following vaginal delivery (7/18) 12/01/2014  . 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 Stuffy and runny nose 05/14/2012   clear drainage from nose   Past Surgical History:  Procedure Laterality Date  . ARTHROSCOPIC REPAIR ACL     bilateral  . KNEE ARTHROSCOPY     right  . NASAL SEPTOPLASTY W/ TURBINOPLASTY  05/21/2012   Procedure: NASAL SEPTOPLASTY WITH TURBINATE REDUCTION;  Surgeon: Jerrell Belfast, MD;  Location: [facility];  Service: ENT;  Laterality: Bilateral;  NASAL SEPTOPLATY AND BILATERAL INFERIOR TURBINATE REDUCTION   . SINUS ENDO W/FUSION  05/21/2012   Procedure: ENDOSCOPIC SINUS SURGERY WITH FUSION NAVIGATION;  Surgeon: Jerrell Belfast, MD;  Location: [facility];  Service: ENT;  Laterality: Bilateral;  BILATERAL ENDOSCOPIC SINUS SURGERY  WITH FUSION SCAN   . WISDOM TOOTH EXTRACTION     Family History: family history includes Arthritis in her maternal grandfather and maternal grandmother; Breast cancer in her maternal grandmother; Cancer in her maternal grandmother and paternal grandfather; Colon cancer in her paternal grandfather; Diabetes in her  father; Hyperlipidemia in her father; Hypertension in her father. Social History:  reports that she has never smoked. She has never used smokeless tobacco. She reports that she drinks alcohol. She reports that she does not use drugs.  ROS: See above otherwise negative  Prenatal labs:  ABO, Rh: --/--/O NEG (05/26 1801) Antibody: PENDING (05/26 1801) Rubella: Immune (11/07 0000) RPR: Nonreactive (11/07 0000)  HBsAg: Negative (11/07 0000)  HIV:Non-reactive (11/07 0000)  GBS: Positive (04/30 0000)  1 hr Glucola: failed gtt Genetic screening: Normal Anatomy US: Normal  Physical Exam:   Dilation: 8 Effacement (%): 90 Station: -1 Exam by:: Miangel Flom, MD Blood pressure 108/62, pulse 67, temperature 98.7 F (37.1 C), temperature source Oral, resp. rate 18, height 5\' 6"  (1.676 m), weight 157 lb 0.6 oz (71.2 kg), last menstrual period 01/11/2017, SpO2 98 %. A&O x 3 HEENT: Normal Lungs: CTAB CV: RRR Abdominal: Soft, Non-tender, Gravid and Estimated fetal weight: 7 1/2 lbs lbs  Lower Extremities: Non-edematous, Non-tender  Pelvic Exam:      Dilatation: 8cm     Effacement: 90%     Station: -1     Presentation: Cephalic  arom with copious amount of clear fluid  Labs:  CBC:  Lab Results  Component Value Date   WBC 10.4 10/08/2017  RBC 3.74 (L) 10/08/2017   HGB 12.1 10/08/2017   HCT 35.7 (L) 10/08/2017   MCV 95.5 10/08/2017   MCH 32.4 10/08/2017   MCHC 33.9 10/08/2017   RDW 13.6 10/08/2017   PLT 179 10/08/2017   CMP:  Lab Results  Component Value Date   NA 138 12/08/2015   K 3.8 12/08/2015   CL 102 12/08/2015   CO2 29 12/08/2015   GLUCOSE 83 12/08/2015   BUN 18 12/08/2015   CREATININE 0.77 12/08/2015   CALCIUM 9.6 12/08/2015   PROT 6.9 12/08/2015   AST 13 12/08/2015   ALT 11 12/08/2015   ALBUMIN 4.5 12/08/2015   ALKPHOS 44 12/08/2015   BILITOT 1.8 (H) 12/08/2015   Urine: No results found for: COLORURINE, APPEARANCEUR, LABSPEC, PHURINE, GLUCOSEU, HGBUR,  BILIRUBINUR, KETONESUR, PROTEINUR, NITRITE, LEUKOCYTESUR   Prenatal Transfer Tool  Maternal Diabetes: Yes:  Diabetes Type:  Diet controlled Genetic Screening: Normal Maternal Ultrasounds/Referrals: Normal Fetal Ultrasounds or other Referrals:  None Maternal Substance Abuse:  No Significant Maternal Medications:  None Significant Maternal Lab Results: Lab values include: Group B Strep positive  FHT: 140s-150s, nml variability, +accels, no decels TOCO: q 2-3 min  Assessment/Plan:  37 y.o. G2P1001 at [redacted]w[redacted]d gestation   1. Active labor - admit, plan svd; pt would like epidural 2. gbs pos - pcn allergy, clindamycin for prophylaxis 3. Rh neg 4. ama - nml genetic screening 5. gdma1 - well controlled with diet 6. Asthma - stable   Charyl Bigger 10/08/2017, 7:47 PM

## 2017-10-08 NOTE — Anesthesia Preprocedure Evaluation (Signed)
Anesthesia Evaluation  Patient identified by MRN, date of birth, ID band Patient awake    Reviewed: Allergy & Precautions, H&P , NPO status , Patient's Chart, lab work & pertinent test results  Airway Mallampati: I  TM Distance: >3 FB Neck ROM: full    Dental no notable dental hx. (+) Teeth Intact   Pulmonary    Pulmonary exam normal breath sounds clear to auscultation       Cardiovascular negative cardio ROS Normal cardiovascular exam Rhythm:Regular Rate:Normal     Neuro/Psych negative neurological ROS  negative psych ROS   GI/Hepatic negative GI ROS, Neg liver ROS,   Endo/Other  diabetes  Renal/GU negative Renal ROS  negative genitourinary   Musculoskeletal negative musculoskeletal ROS (+)   Abdominal Normal abdominal exam  (+)   Peds  Hematology negative hematology ROS (+)   Anesthesia Other Findings   Reproductive/Obstetrics (+) Pregnancy                             Anesthesia Physical  Anesthesia Plan  ASA: II  Anesthesia Plan: Epidural   Post-op Pain Management:    Induction:   PONV Risk Score and Plan:   Airway Management Planned:   Additional Equipment:   Intra-op Plan:   Post-operative Plan:   Informed Consent: I have reviewed the patients History and Physical, chart, labs and discussed the procedure including the risks, benefits and alternatives for the proposed anesthesia with the patient or authorized representative who has indicated his/her understanding and acceptance.     Plan Discussed with:   Anesthesia Plan Comments:         Anesthesia Quick Evaluation

## 2017-10-09 DIAGNOSIS — O26899 Other specified pregnancy related conditions, unspecified trimester: Secondary | ICD-10-CM

## 2017-10-09 DIAGNOSIS — Z6791 Unspecified blood type, Rh negative: Secondary | ICD-10-CM

## 2017-10-09 LAB — CBC
HCT: 32.9 % — ABNORMAL LOW (ref 36.0–46.0)
Hemoglobin: 11.1 g/dL — ABNORMAL LOW (ref 12.0–15.0)
MCH: 32.2 pg (ref 26.0–34.0)
MCHC: 33.7 g/dL (ref 30.0–36.0)
MCV: 95.4 fL (ref 78.0–100.0)
Platelets: 160 K/uL (ref 150–400)
RBC: 3.45 MIL/uL — ABNORMAL LOW (ref 3.87–5.11)
RDW: 13.6 % (ref 11.5–15.5)
WBC: 12.1 K/uL — ABNORMAL HIGH (ref 4.0–10.5)

## 2017-10-09 LAB — RPR: RPR Ser Ql: NONREACTIVE

## 2017-10-09 MED ORDER — RHO D IMMUNE GLOBULIN 1500 UNIT/2ML IJ SOSY
300.0000 ug | PREFILLED_SYRINGE | Freq: Once | INTRAMUSCULAR | Status: AC
  Administered 2017-10-09: 300 ug via INTRAVENOUS
  Filled 2017-10-09: qty 2

## 2017-10-09 NOTE — Lactation Note (Signed)
This note was copied from a baby's chart. Lactation Consultation Note  Patient Name: Michelle Alvarez OVFIE'P Date: 10/09/2017    Mother trying to sleep as I entered.  Baby very fussy but mother states that she just finished feeding and baby gets upset after getting her diaper changed.  Mother did not feel like baby needed to latch again.  Father holding crying baby and I offered a gloved finger to try to console her.  She sucked hard on my gloved finger and quieted a few minutes later with father rocking her in his arms.  Mother does not have any questions/concerns at this time other than wanting to know how to obtain her [employer] employee pump tomorrow.  I told her I would inform day shift lactation consultant and this could be handled in the morning.  Mother satisfied since she wants to get back to sleep.  She will call as needed for assistance throughout the night.        Michelle Alvarez 10/09/2017, 11:12 PM

## 2017-10-09 NOTE — Progress Notes (Signed)
PPD #1, SVD, 2nd degree and left vaginal repair, baby girl   S:  Reports feeling sore and tired, but overall well              Tolerating po/ No nausea or vomiting / Denies dizziness or SOB             Bleeding is moderate             Pain controlled with Motrin and Tylenol              Up ad lib / ambulatory / voiding QS  Newborn breast feeding - reports baby is latching well   O:               VS: BP 96/60 (BP Location: Right Arm)   Pulse 70   Temp 98 F (36.7 C)   Resp 16   Ht 5\' 6"  (1.676 m)   Wt 71.2 kg (157 lb 0.6 oz)   LMP 01/11/2017 (Exact Date)   SpO2 99%   Breastfeeding? Unknown   BMI 25.35 kg/m    LABS:              Recent Labs    10/08/17 1801 10/09/17 0346  WBC 10.4 12.1*  HGB 12.1 11.1*  PLT 179 160               Blood type: --/--/O NEG (05/26 1801)  Rubella: Immune (11/07 0000)                     I&O: Intake/Output      05/26 0701 - 05/27 0700 05/27 0701 - 05/28 0700   Blood 250    Total Output 250    Net -250                       Physical Exam:             Alert and oriented X3  Lungs: Clear and unlabored  Heart: regular rate and rhythm / no murmurs  Abdomen: soft, non-tender, non-distended              Fundus: firm, non-tender, U-2  Perineum: well approximated 2nd degree and left vaginal repair, mild edema noted, no ecchymosis or erythema   Lochia: small, no clots   Extremities: no edema, no calf pain or tenderness    A: PPD # 1, SVD  2nd degree and left vaginal repair   RH Negative, Baby O Positive   A1GDM - stable, delivered   Doing well - stable status  P: Routine post partum orders  Rhogam today   See lactation today   Encouraged to rest when baby rests  May D/C IV after Rhogam  Anticipate discharge home tomorrow   Lars Pinks, MSN, CNM [facility]

## 2017-10-09 NOTE — Anesthesia Postprocedure Evaluation (Signed)
Anesthesia Post Note  Patient: Michelle Alvarez  Procedure(s) Performed: AN AD HOC LABOR EPIDURAL     Patient location during evaluation: Mother Baby Anesthesia Type: Epidural Level of consciousness: awake and alert Pain management: pain level controlled Vital Signs Assessment: post-procedure vital signs reviewed and stable Respiratory status: spontaneous breathing, nonlabored ventilation and respiratory function stable Cardiovascular status: stable Postop Assessment: no headache, no backache and epidural receding Anesthetic complications: no    Last Vitals:  Vitals:   10/08/17 2342 10/09/17 0500  BP: (!) 98/49 96/60  Pulse: 64 70  Resp: 18 16  Temp: 36.9 C 36.7 C  SpO2: 97% 99%    Last Pain:  Vitals:   10/09/17 0500  TempSrc:   PainSc: 0-No pain   Pain Goal: Patients Stated Pain Goal: 7 (10/08/17 1719)               [name]

## 2017-10-10 LAB — RH IG WORKUP (INCLUDES ABO/RH)
ABO/RH(D): O NEG
Fetal Screen: NEGATIVE
Gestational Age(Wks): 38.5
Unit division: 0

## 2017-10-10 MED ORDER — IBUPROFEN 600 MG PO TABS: 600.0000 mg | ORAL_TABLET | Freq: Four times a day (QID) | ORAL | 0 refills | Status: DC

## 2017-10-10 MED FILL — IBUPROFEN 600 MG TABLET: 600 | 7 days supply | Qty: 30 | Fill #0

## 2017-10-10 NOTE — Discharge Summary (Signed)
Obstetric Discharge Summary   Patient Name: Michelle Alvarez DOB: 1981/05/16 MRN: [id number]  Date of Admission: 10/08/2017 Date of Discharge: 10/10/2017 Date of Delivery: 10/08/17 Gestational Age at Delivery: [redacted]w[redacted]d  Primary OB: [facility] - Dr. Benjie Karvonen   Antepartum complications:  - GDMA1, well controlled with diet; last growth u/s at 82 wga: efw 57% (4'10")  Prenatal Labs:  ABO, Rh: --/--/O NEG (05/26 1801) Antibody: PENDING (05/26 1801) Rubella: Immune (11/07 0000) RPR: Nonreactive (11/07 0000)  HBsAg: Negative (11/07 0000)  HIV:Non-reactive (11/07 0000)  GBS: Positive (04/30 0000)  1 hr Glucola: failed gtt Genetic screening: Normal Anatomy US: Normal  Admitting Diagnosis: Active labor at 38+4 weeks   Secondary Diagnoses: Patient Active Problem List   Diagnosis Date Noted  . SVD (spontaneous vaginal delivery) 10/09/2017  . Second-degree perineal laceration, with delivery 10/09/2017  . Postpartum care following vaginal delivery (5/26) 10/09/2017  . Rh negative, delivered, current hospitalization 10/09/2017  . Rhinitis, allergic 01/24/2015  . Mild persistent asthma 01/24/2015  . Chorioamnionitis 12/18/2014  . Single umbilical artery 34/74/2595  . Vacuum extractor delivery, delivered 12/01/2014  . Pregnancy 11/30/2014  . Sinusitis, chronic 05/21/2012    Class: Chronic  . Deviated nasal septum 05/21/2012    Class: Chronic  . Sinusitis acute 11/18/2011  . Toenail fungus 11/18/2011  . Allergic rhinitis 06/29/2011  . General medical examination 06/29/2011    Augmentation: AROM Complications: None  Date of Delivery: 10/08/17 Delivered By: Dr. Murrell Redden Delivery Type: spontaneous vaginal delivery Anesthesia: epidural Placenta: sponatneous Laceration: 2nd degree and left vaginal repair  Episiotomy: none  Newborn Data: Live born female  Birth Weight: 7 lb 8.1 oz (3405 g) APGAR: 8, 8  Newborn Delivery   Birth date/time:  10/08/2017 21:02:00 Delivery type:   Vaginal, Spontaneous     Hospital/Postpartum Course  (Vaginal Delivery): Pt. Admitted for active labor at 38+4 weeks.  She progress with AROM.  She received Clindamycin for GBS prophylaxis, but was inadequately treated.  See notes for details. Patient had an uncomplicated postpartum course.  By time of discharge on PPD#2, her pain was controlled on oral pain medications; she had appropriate lochia and was ambulating, voiding without difficulty and tolerating regular diet.  She was deemed stable for discharge to home.     Labs: CBC Latest Ref Rng & Units 10/09/2017 10/08/2017 12/08/2015  WBC 4.0 - 10.5 K/uL 12.1(H) 10.4 5.7  Hemoglobin 12.0 - 15.0 g/dL 11.1(L) 12.1 13.5  Hematocrit 36.0 - 46.0 % 32.9(L) 35.7(L) 40.5  Platelets 150 - 400 K/uL 160 179 178.0   O NEG  Physical exam:  BP 99/67 (BP Location: Right Arm)   Pulse 63   Temp 98.1 F (36.7 C) (Oral)   Resp 16   Ht 5\' 6"  (1.676 m)   Wt 71.2 kg (157 lb 0.6 oz)   LMP 01/11/2017 (Exact Date)   SpO2 99%   Breastfeeding? Unknown   BMI 25.35 kg/m  General: alert and no distress Pulm: normal respiratory effort Lochia: appropriate Pt. Declined exam today   Disposition: stable, discharge to home Baby Feeding: breast milk Baby Disposition: home with mom  Contraception: unsure  Rh Immune globulin given: given 09/2717 Rubella vaccine given: N/A Tdap vaccine given in AP or PP setting: UTD Flu vaccine given in AP or PP setting: UTD   Plan:  ELEINA JERGENS was discharged to home in good condition. Follow-up appointment at [facility] in 6 weeks.  Discharge Instructions: Per After Visit Summary. Refer to After Visit Summary and  [facility] discharge booklet  Activity: Advance as tolerated. Pelvic rest for 6 weeks.   Diet: Regular, Heart Healthy Discharge Medications: Allergies as of 10/10/2017      Reactions   Cephalexin Rash   Doxycycline Rash   Penicillins Rash   Has patient had a PCN reaction causing  immediate rash, facial/tongue/throat swelling, SOB or lightheadedness with hypotension: No Has patient had a PCN reaction causing severe rash involving mucus membranes or skin necrosis: No Has patient had a PCN reaction that required hospitalization: No Has patient had a PCN reaction occurring within the last 10 years: No If all of the above answers are "NO", then may proceed with Cephalosporin use.   Vancomycin Itching      Medication List    TAKE these medications   albuterol 108 (90 Base) MCG/ACT inhaler Commonly known as:  PROVENTIL HFA;VENTOLIN HFA Inhale 2 puffs every 4 (four) hours as needed into the lungs.   budesonide 180 MCG/ACT inhaler Commonly known as:  PULMICORT Inhale 2 puffs 2 (two) times daily into the lungs.   ibuprofen 600 MG tablet Commonly known as:  ADVIL,MOTRIN Take 1 tablet (600 mg total) by mouth every 6 (six) hours.   loratadine 10 MG tablet Commonly known as:  CLARITIN Take 10 mg daily by mouth.   prenatal multivitamin Tabs tablet Take 1 tablet by mouth daily at 12 noon.   ranitidine 75 MG tablet Commonly known as:  ZANTAC Take 75 mg by mouth daily as needed for heartburn.            Discharge Care Instructions  (From admission, onward)        Start     Ordered   10/10/17 0000  Discharge wound care:    Comments:  Warm sitz baths 3-5 times per day   10/10/17 0908     Outpatient follow up:  Follow-up Information    Azucena Fallen, MD. Schedule an appointment as soon as possible for a visit in 6 week(s).   Specialty:  Obstetrics and Gynecology Why:  Postpartum visit  Contact information: [place]  [address] [phone]           Signed:  Lars Pinks, MSN, CNM [facility]

## 2017-10-10 NOTE — Progress Notes (Signed)
PPD #2, SVD, 2nd degree and left vaginal repair, baby girl "Sophie"  S:  Reports feeling good, better today, reports she is ready to be discharged home today.  GBS treatment inadequate - peds to decide on discharge time for baby; mom okay for discharge today             Tolerating po/ No nausea or vomiting / Denies dizziness or SOB             Bleeding is light             Pain controlled with Motrin             Up ad lib / ambulatory / voiding QS  Newborn breast feeding - going well per patient; cluster fed overnight  O:               VS: BP 99/67 (BP Location: Right Arm)   Pulse 63   Temp 98.1 F (36.7 C) (Oral)   Resp 16   Ht 5\' 6"  (1.676 m)   Wt 71.2 kg (157 lb 0.6 oz)   LMP 01/11/2017 (Exact Date)   SpO2 99%   Breastfeeding? Unknown   BMI 25.35 kg/m    LABS:              Recent Labs    10/08/17 1801 10/09/17 0346  WBC 10.4 12.1*  HGB 12.1 11.1*  PLT 179 160               Blood type: --/--/O NEG (05/27 0346)  Rubella: Immune (11/07 0000)                                Physical Exam:             Alert and oriented X3  Pt. Declined exam - states everything feels normal    A:         PPD # 2, SVD             2nd degree and left vaginal repair              RH Negative, Baby O Positive - s/p Rhogam 5/27             A1GDM - stable, delivered              Doing well - stable status   P: Routine post partum orders  Discharge home today  [facility] discharge book, warning s/s, and instructions reviewed   Plan for 2hr GTT at [facility] visit   F/u in 6 weeks with Dr. Diamondville Sink, MSN, CNM [facility]

## 2017-10-12 LAB — BPAM RBC
BLOOD PRODUCT EXPIRATION DATE: 201906202359
Blood Product Expiration Date: 201906152359
Unit Type and Rh: 9500
Unit Type and Rh: 9500

## 2017-10-12 LAB — TYPE AND SCREEN
ABO/RH(D): O NEG
ANTIBODY SCREEN: POSITIVE
UNIT DIVISION: 0
Unit division: 0

## 2017-11-20 MED FILL — PULMICORT 180 MCG FLEXHALER: 180 | 30 days supply | Qty: 1 | Fill #3 | Status: TO

## 2018-02-02 MED FILL — PULMICORT 180 MCG FLEXHALER: 180 | 30 days supply | Qty: 1 | Fill #0

## 2018-02-20 ENCOUNTER — Encounter: Payer: Self-pay | Admitting: Family Medicine

## 2018-02-20 DIAGNOSIS — J45909 Unspecified asthma, uncomplicated: Secondary | ICD-10-CM

## 2018-02-20 DIAGNOSIS — J302 Other seasonal allergic rhinitis: Secondary | ICD-10-CM

## 2018-03-06 ENCOUNTER — Ambulatory Visit (INDEPENDENT_AMBULATORY_CARE_PROVIDER_SITE_OTHER): Payer: No Typology Code available for payment source | Admitting: Allergy and Immunology

## 2018-03-06 ENCOUNTER — Encounter: Payer: Self-pay | Admitting: Allergy and Immunology

## 2018-03-06 VITALS — BP 116/86 | HR 73 | Resp 16

## 2018-03-06 DIAGNOSIS — J4540 Moderate persistent asthma, uncomplicated: Secondary | ICD-10-CM

## 2018-03-06 DIAGNOSIS — J3089 Other allergic rhinitis: Secondary | ICD-10-CM | POA: Diagnosis not present

## 2018-03-06 MED ORDER — BUDESONIDE-FORMOTEROL FUMARATE 160-4.5 MCG/ACT IN AERO: 2.0000 | INHALATION_SPRAY | Freq: Two times a day (BID) | RESPIRATORY_TRACT | 5 refills | Status: DC

## 2018-03-06 MED ORDER — ALBUTEROL SULFATE HFA 108 (90 BASE) MCG/ACT IN AERS: 2.0000 | INHALATION_SPRAY | RESPIRATORY_TRACT | 1 refills | Status: DC | PRN

## 2018-03-06 MED ORDER — METHYLPREDNISOLONE ACETATE 80 MG/ML IJ SUSP
80.0000 mg | Freq: Once | INTRAMUSCULAR | Status: AC
  Administered 2018-03-06: 80 mg via INTRAMUSCULAR

## 2018-03-06 MED FILL — SYMBICORT 160-4.5 MCG INH: 160-4.5 | 30 days supply | Qty: 10 | Fill #0

## 2018-03-06 MED FILL — VENTOLIN HFA 90 MCG INHALER: 108 (90 BAS | 17 days supply | Qty: 18 | Fill #0

## 2018-03-06 NOTE — Progress Notes (Signed)
Follow-up Note  Referring Provider: Midge Minium, MD Primary Provider: Midge Minium, MD Date of Office Visit: 03/06/2018  Subjective:   Michelle Alvarez (DOB: 04/11/1981) is a 37 y.o. female who returns to the [facility] [place] on 03/06/2018 in re-evaluation of the following:  HPI: Michelle Alvarez presents to this clinic in evaluation of asthma and allergic rhinitis.  I have not seen her in this clinic since 28 March 2017.  She has really done well with her asthma and has not required a systemic steroid or an antibiotic.  However, ever since she delivered her baby in May 2019 she has had some intermittent issues with asthma manifested as intermittent wheezing and coughing and some degree of post exercise chest congestion.  Several weeks back she had to use her short acting bronchodilator twice a day for increased asthma activity.  She does increase her Pulmicort to high dose during these timeframes.  She ran this weekend and although she could run her 2 to 3 mile run she did have significant coughing after completing that run.  There has not really been a significant environmental change that may account for her increased asthma activity and she is not on any new medications.  Her nose has been doing well on her current plan of a nasal steroid.  She did obtain a flu vaccine.  Allergies as of 03/06/2018      Reactions   Cephalexin Rash   Doxycycline Rash   Penicillins Rash   Has patient had a PCN reaction causing immediate rash, facial/tongue/throat swelling, SOB or lightheadedness with hypotension: No Has patient had a PCN reaction causing severe rash involving mucus membranes or skin necrosis: No Has patient had a PCN reaction that required hospitalization: No Has patient had a PCN reaction occurring within the last 10 years: No If all of the above answers are "NO", then may proceed with Cephalosporin use.   Vancomycin Itching      Medication List      albuterol 108 (90 Base) MCG/ACT inhaler Commonly known as:  PROVENTIL HFA;VENTOLIN HFA Inhale 2 puffs every 4 (four) hours as needed into the lungs.   budesonide 180 MCG/ACT inhaler Commonly known as:  PULMICORT Inhale 2 puffs 2 (two) times daily into the lungs.   loratadine 10 MG tablet Commonly known as:  CLARITIN Take 10 mg daily by mouth.   prenatal multivitamin Tabs tablet Take 1 tablet by mouth daily at 12 noon.   RHINOCORT ALLERGY NA Place 1 spray into both nostrils daily as needed.       Past Medical History:  Diagnosis Date  . Asthma    PRN rescue inhaler   . Chronic sinusitis 04/2012  . Cough 05/14/2012  . Deviated nasal septum 04/2012  . Gestational diabetes    diet controlled/ self BS checks  . Hx of varicella   . Postpartum care following vaginal delivery (7/18) 12/01/2014  . 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 Stuffy and runny nose 05/14/2012   clear drainage from nose    Past Surgical History:  Procedure Laterality Date  . ARTHROSCOPIC REPAIR ACL     bilateral  . KNEE ARTHROSCOPY     right  . NASAL SEPTOPLASTY W/ TURBINOPLASTY  05/21/2012   Procedure: NASAL SEPTOPLASTY WITH TURBINATE REDUCTION;  Surgeon: Jerrell Belfast, MD;  Location: [facility];  Service: ENT;  Laterality: Bilateral;  NASAL SEPTOPLATY AND BILATERAL INFERIOR TURBINATE REDUCTION   . SINUS ENDO  W/FUSION  05/21/2012   Procedure: ENDOSCOPIC SINUS SURGERY WITH FUSION NAVIGATION;  Surgeon: Jerrell Belfast, MD;  Location: [facility];  Service: ENT;  Laterality: Bilateral;  BILATERAL ENDOSCOPIC SINUS SURGERY  WITH FUSION SCAN   . WISDOM TOOTH EXTRACTION      Review of systems negative except as noted in HPI / PMHx or noted below:  Review of Systems  Constitutional: Negative.   HENT: Negative.   Eyes: Negative.   Respiratory: Negative.   Cardiovascular: Negative.   Gastrointestinal: Negative.   Genitourinary: Negative.   Musculoskeletal: Negative.    Skin: Negative.   Neurological: Negative.   Endo/Heme/Allergies: Negative.   Psychiatric/Behavioral: Negative.      Objective:   Vitals:   03/06/18 1602  BP: 116/86  Pulse: 73  Resp: 16  SpO2: 95%          Physical Exam  HENT:  Head: Normocephalic.  Right Ear: Tympanic membrane, external ear and ear canal normal.  Left Ear: Tympanic membrane, external ear and ear canal normal.  Nose: Nose normal. No mucosal edema or rhinorrhea.  Mouth/Throat: Uvula is midline, oropharynx is clear and moist and mucous membranes are normal. No oropharyngeal exudate.  Eyes: Conjunctivae are normal.  Neck: Trachea normal. No tracheal tenderness present. No tracheal deviation present. No thyromegaly present.  Cardiovascular: Normal rate, regular rhythm, S1 normal, S2 normal and normal heart sounds.  No murmur heard. Pulmonary/Chest: Breath sounds normal. No stridor. No respiratory distress. She has no wheezes. She has no rales.  Musculoskeletal: She exhibits no edema.  Lymphadenopathy:       Head (right side): No tonsillar adenopathy present.       Head (left side): No tonsillar adenopathy present.    She has no cervical adenopathy.  Neurological: She is alert.  Skin: No rash noted. She is not diaphoretic. No erythema. Nails show no clubbing.    Diagnostics:    Spirometry was performed and demonstrated an FEV1 of 3.3.6 at 100 % of predicted.  The patient had an Asthma Control Test with the following results: ACT Total Score: 18.    Assessment and Plan:   1. Not well controlled moderate persistent asthma   2. Other allergic rhinitis     1.  Discontinue Pulmicort    2.  Start Symbicort 160 -2 inhalations twice a day with spacer  3.  Depo-Medrol 80 IM delivered in clinic today  4. Continue OTC Rhinocort one spray each nostril one time per day during periods of upper airway symptoms  5. Continue Proair Hfa and antihistamine if needed.  6. Return to clinic in January 2020 or  earlier if problem   I will assume that Michelle Alvarez has some degree of inflammation in her lower airway and treat her with a systemic steroid and switch over from Pulmicort to Symbicort for the next 3 months.  Assuming she does well with this plan I will see her back in this clinic in January 2020 and there may be an opportunity to consolidate her treatment at that point in time.  It should be noted that prior to her most recent asthma activity she has really had very good control of her asthma for years while using Pulmicort.  She will contact me during the interval should there be a problem.  [name], MD Allergy / Immunology [place]

## 2018-03-06 NOTE — Patient Instructions (Addendum)
  1.  Discontinue Pulmicort    2.  Start Symbicort 160 -2 inhalations twice a day with spacer  3.  Depo-Medrol 80 IM delivered in clinic today  4. Continue OTC Rhinocort one spray each nostril one time per day during periods of upper airway symptoms  5. Continue Proair Hfa and antihistamine if needed.  6. Return to clinic in January 2020 or earlier if problem

## 2018-03-07 ENCOUNTER — Encounter: Payer: Self-pay | Admitting: Allergy and Immunology

## 2018-05-17 MED FILL — SYMBICORT 160-4.5 MCG INH: 160-4.5 | 30 days supply | Qty: 10 | Fill #1

## 2018-05-29 ENCOUNTER — Encounter: Payer: Self-pay | Admitting: Family Medicine

## 2018-06-05 ENCOUNTER — Encounter: Payer: Self-pay | Admitting: Allergy and Immunology

## 2018-06-05 ENCOUNTER — Ambulatory Visit (INDEPENDENT_AMBULATORY_CARE_PROVIDER_SITE_OTHER): Payer: No Typology Code available for payment source | Admitting: Allergy and Immunology

## 2018-06-05 VITALS — BP 102/60 | HR 74 | Resp 16 | Ht 66.5 in | Wt 127.0 lb

## 2018-06-05 DIAGNOSIS — J3089 Other allergic rhinitis: Secondary | ICD-10-CM | POA: Diagnosis not present

## 2018-06-05 DIAGNOSIS — J4540 Moderate persistent asthma, uncomplicated: Secondary | ICD-10-CM

## 2018-06-05 NOTE — Patient Instructions (Signed)
  1.  Continue Symbicort 160 -2 inhalations 1-2 a day with spacer  2.  Continue OTC Rhinocort one spray each nostril one time per day during periods of upper airway symptoms  3. Continue Proair Hfa and antihistamine if needed.  4. Return to clinic in 1 year or earlier if problem

## 2018-06-05 NOTE — Progress Notes (Signed)
Follow-up Note  Referring Provider: Midge Minium, MD Primary Provider: Midge Minium, MD Date of Office Visit: 06/05/2018  Subjective:   Michelle Alvarez (DOB: January 19, 1981) is a 38 y.o. female who returns to the [facility] on 06/05/2018 in re-evaluation of the following:  HPI: Michelle Alvarez returns to this clinic in reevaluation of her asthma and allergic rhinitis.  I last saw her in this clinic on 06 March 2018 at which point in time she appeared to be having little bit more activity of asthma requiring a systemic steroid and a change from Pulmicort to Symbicort.  She has really done well and has resolved all of her respiratory tract symptoms and now relies on the use of Symbicort just 1 time per day.  She is exercising without any difficulty and rarely uses a short acting bronchodilator.  Her nose is doing quite well while occasionally using a nasal steroid.  She has not required a systemic steroid or antibiotics since being seen in this clinic for an airway issue.  Allergies as of 06/05/2018      Reactions   Cephalexin Rash   Doxycycline Rash   Penicillins Rash   Has patient had a PCN reaction causing immediate rash, facial/tongue/throat swelling, SOB or lightheadedness with hypotension: No Has patient had a PCN reaction causing severe rash involving mucus membranes or skin necrosis: No Has patient had a PCN reaction that required hospitalization: No Has patient had a PCN reaction occurring within the last 10 years: No If all of the above answers are "NO", then may proceed with Cephalosporin use.   Vancomycin Itching      Medication List      albuterol 108 (90 Base) MCG/ACT inhaler Commonly known as:  PROVENTIL HFA;VENTOLIN HFA Inhale 2 puffs into the lungs every 4 (four) hours as needed.   budesonide-formoterol 160-4.5 MCG/ACT inhaler Commonly known as:  SYMBICORT Inhale 2 puffs into the lungs 2 (two) times daily.   loratadine 10 MG  tablet Commonly known as:  CLARITIN Take 10 mg daily by mouth.   prenatal multivitamin Tabs tablet Take 1 tablet by mouth daily at 12 noon.   RHINOCORT ALLERGY NA Place 1 spray into both nostrils daily as needed.       Past Medical History:  Diagnosis Date  . Asthma    PRN rescue inhaler   . Chronic sinusitis 04/2012  . Cough 05/14/2012  . Deviated nasal septum 04/2012  . Gestational diabetes    diet controlled/ self BS checks  . Hx of varicella   . Postpartum care following vaginal delivery (7/18) 12/01/2014  . 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 Stuffy and runny nose 05/14/2012   clear drainage from nose    Past Surgical History:  Procedure Laterality Date  . ARTHROSCOPIC REPAIR ACL     bilateral  . KNEE ARTHROSCOPY     right  . NASAL SEPTOPLASTY W/ TURBINOPLASTY  05/21/2012   Procedure: NASAL SEPTOPLASTY WITH TURBINATE REDUCTION;  Surgeon: Jerrell Belfast, MD;  Location: [facility];  Service: ENT;  Laterality: Bilateral;  NASAL SEPTOPLATY AND BILATERAL INFERIOR TURBINATE REDUCTION   . SINUS ENDO W/FUSION  05/21/2012   Procedure: ENDOSCOPIC SINUS SURGERY WITH FUSION NAVIGATION;  Surgeon: Jerrell Belfast, MD;  Location: [facility];  Service: ENT;  Laterality: Bilateral;  BILATERAL ENDOSCOPIC SINUS SURGERY  WITH FUSION SCAN   . WISDOM TOOTH EXTRACTION      Review of systems negative  except as noted in HPI / PMHx or noted below:  Review of Systems  Constitutional: Negative.   HENT: Negative.   Eyes: Negative.   Respiratory: Negative.   Cardiovascular: Negative.   Gastrointestinal: Negative.   Genitourinary: Negative.   Musculoskeletal: Negative.   Skin: Negative.   Neurological: Negative.   Endo/Heme/Allergies: Negative.   Psychiatric/Behavioral: Negative.      Objective:   Vitals:   06/05/18 1601  BP: 102/60  Pulse: 74  Resp: 16  SpO2: 98%   Height: 5' 6.5" (168.9 cm)  Weight: 127 lb (57.6 kg)   Physical  Exam Constitutional:      Appearance: She is not diaphoretic.  HENT:     Head: Normocephalic.     Right Ear: Tympanic membrane, ear canal and external ear normal.     Left Ear: Tympanic membrane, ear canal and external ear normal.     Nose: Nose normal. No mucosal edema or rhinorrhea.     Mouth/Throat:     Pharynx: Uvula midline. No oropharyngeal exudate.  Eyes:     Conjunctiva/sclera: Conjunctivae normal.  Neck:     Thyroid: No thyromegaly.     Trachea: Trachea normal. No tracheal tenderness or tracheal deviation.  Cardiovascular:     Rate and Rhythm: Normal rate and regular rhythm.     Heart sounds: Normal heart sounds, S1 normal and S2 normal. No murmur.  Pulmonary:     Effort: No respiratory distress.     Breath sounds: Normal breath sounds. No stridor. No wheezing or rales.  Lymphadenopathy:     Head:     Right side of head: No tonsillar adenopathy.     Left side of head: No tonsillar adenopathy.     Cervical: No cervical adenopathy.  Skin:    Findings: No erythema or rash.     Nails: There is no clubbing.   Neurological:     Mental Status: She is alert.     Diagnostics:    Spirometry was performed and demonstrated an FEV1 of 3.62 at 107 % of predicted.  The patient had an Asthma Control Test with the following results: ACT Total Score: 22.    Assessment and Plan:   1. Asthma, moderate persistent, well-controlled   2. Other allergic rhinitis     1.  Continue Symbicort 160 -2 inhalations 1-2 a day with spacer  2.  Continue OTC Rhinocort one spray each nostril one time per day during periods of upper airway symptoms  3. Continue Proair Hfa and antihistamine if needed.  4. Return to clinic in 1 year or earlier if problem   Michelle Alvarez was really doing quite well.  She understands her disease process quite well and understands how her medications work and the appropriate dosing of her medications.  I have refilled all of her medications and she will return to  this clinic in 1 year or earlier if there is a problem.  Michelle Katz, MD Allergy / Immun[facility]

## 2018-06-06 ENCOUNTER — Encounter: Payer: Self-pay | Admitting: Allergy and Immunology

## 2018-06-29 MED FILL — SYMBICORT 160-4.5 MCG INH: 160-4.5 | 30 days supply | Qty: 10 | Fill #2

## 2018-07-27 MED FILL — SYMBICORT 160-4.5 MCG INH: 160-4.5 | 30 days supply | Qty: 10 | Fill #3

## 2018-08-21 ENCOUNTER — Ambulatory Visit: Payer: No Typology Code available for payment source | Admitting: Family Medicine

## 2018-08-23 ENCOUNTER — Encounter: Payer: No Typology Code available for payment source | Admitting: Family Medicine

## 2018-10-31 MED FILL — SYMBICORT 160-4.5 MCG INH: 160-4.5 | 30 days supply | Qty: 10 | Fill #4

## 2018-11-21 ENCOUNTER — Ambulatory Visit (INDEPENDENT_AMBULATORY_CARE_PROVIDER_SITE_OTHER): Payer: No Typology Code available for payment source | Admitting: Family Medicine

## 2018-11-21 ENCOUNTER — Other Ambulatory Visit: Payer: Self-pay

## 2018-11-21 ENCOUNTER — Encounter: Payer: Self-pay | Admitting: Family Medicine

## 2018-11-21 VITALS — BP 100/61 | HR 65 | Temp 99.0°F | Resp 16 | Ht 67.0 in | Wt 129.2 lb

## 2018-11-21 DIAGNOSIS — D2271 Melanocytic nevi of right lower limb, including hip: Secondary | ICD-10-CM | POA: Diagnosis not present

## 2018-11-21 DIAGNOSIS — Z0000 Encounter for general adult medical examination without abnormal findings: Secondary | ICD-10-CM | POA: Diagnosis not present

## 2018-11-21 NOTE — Assessment & Plan Note (Signed)
Pt's PE WNL.  UTD on GYN, immunizations.  No need to check labs.  Anticipatory guidance provided.

## 2018-11-21 NOTE — Progress Notes (Signed)
   Subjective:    Patient ID: Michelle Alvarez, female    DOB: 09-Apr-1981, 38 y.o.   MRN: [id number]  HPI CPE- UTD on pap, too young for mammos or colonoscopy.  UTD on Tdap, flu.  No concerns today.   Review of Systems Patient reports no vision/ hearing changes, adenopathy,fever, weight change,  persistant/recurrent hoarseness , swallowing issues, chest pain, palpitations, edema, persistant/recurrent cough, hemoptysis, dyspnea (rest/exertional/paroxysmal nocturnal), gastrointestinal bleeding (melena, rectal bleeding), abdominal pain, significant heartburn, bowel changes, GU symptoms (dysuria, hematuria, incontinence), Gyn symptoms (abnormal  bleeding, pain),  syncope, focal weakness, memory loss, numbness & tingling, skin/hair/nail changes, abnormal bruising or bleeding, anxiety, or depression.   + irritated mole    Objective:   Physical Exam General Appearance:    Alert, cooperative, no distress, appears stated age  Head:    Normocephalic, without obvious abnormality, atraumatic  Eyes:    PERRL, conjunctiva/corneas clear, EOM's intact, fundi    benign, both eyes  Ears:    Normal TM's and external ear canals, both ears  Nose:   Nares normal, septum midline, mucosa normal, no drainage    or sinus tenderness  Throat:   Lips, mucosa, and tongue normal; teeth and gums normal  Neck:   Supple, symmetrical, trachea midline, no adenopathy;    Thyroid: no enlargement/tenderness/nodules  Back:     Symmetric, no curvature, ROM normal, no CVA tenderness  Lungs:     Clear to auscultation bilaterally, respirations unlabored  Chest Wall:    No tenderness or deformity   Heart:    Regular rate and rhythm, S1 and S2 normal, no murmur, rub   or gallop  Breast Exam:    Deferred to GYN  Abdomen:     Soft, non-tender, bowel sounds active all four quadrants,    no masses, no organomegaly  Genitalia:    Deferred to GYN  Rectal:    Extremities:   Extremities normal, atraumatic, no cyanosis or edema   Pulses:   2+ and symmetric all extremities  Skin:   Skin color, texture, turgor normal, no rashes or lesions  Lymph nodes:   Cervical, supraclavicular, and axillary nodes normal  Neurologic:   CNII-XII intact, normal strength, sensation and reflexes    throughout          Assessment & Plan:

## 2018-11-21 NOTE — Patient Instructions (Signed)
Follow up in 1 year or as needed Keep up the good work!  You look great!! Call with any questions or concerns Stay Safe!!!

## 2019-01-01 MED FILL — SYMBICORT 160-4.5 MCG INH: 160-4.5 | 30 days supply | Qty: 10 | Fill #5

## 2019-03-05 ENCOUNTER — Other Ambulatory Visit: Payer: Self-pay | Admitting: Allergy and Immunology

## 2019-03-05 MED FILL — SYMBICORT 160-4.5 MCG INH: 160-4.5 | 30 days supply | Qty: 10 | Fill #0

## 2019-05-02 MED FILL — SYMBICORT 160-4.5 MCG INH: 160-4.5 | 30 days supply | Qty: 10 | Fill #1

## 2019-06-13 ENCOUNTER — Other Ambulatory Visit: Payer: Self-pay

## 2019-06-13 ENCOUNTER — Encounter: Payer: Self-pay | Admitting: Surgery

## 2019-06-13 MED ORDER — METHYLPREDNISOLONE 4 MG PO TABS: ORAL_TABLET | ORAL | 0 refills | Status: DC

## 2019-06-13 MED FILL — METHYLPREDNISOLONE 4 MG TBP: 4 | 6 days supply | Qty: 21 | Fill #0

## 2019-06-13 NOTE — Progress Notes (Signed)
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  Patient states that she was running outside for short period with her young son and she became winded.  Had some wheezing and nonproductive cough.  Patient has been afebrile.  She currently uses Symbicort twice daily and has a albuterol rescue inhaler that she has infrequently used.  Last seen by her allergist Dr. [name] June 05, 2018.  States that she has some dyspnea with increased activity even going up and down stairs and has dry cough.  Some wheezing since yesterday with increased activity but has not had any while at work today.  Exam Pleasant white female alert and oriented in no respiratory distress.  Heart sounds normal.  Lungs CTA bilaterally.  No wheezes rales or rhonchi.   Plan Advised patient to continue using her Symbicort twice daily and must use rescue inhaler as needed.  I did speak with our primary care/sports medicine physician Dr. Legrand Como hilts and he agreed to see patient tomorrow morning since she will not be able to get in with her allergist until possibly early next week.  Dr. Bruna Potter office attempted to get her in tomorrow but her schedule conflicted with that.  I did send in a prescription for Medrol Dosepak taper to the [facility] and she will start that today.  Again stressed the importance of seeing Dr. Junius Roads tomorrow morning.  All questions answered.

## 2019-06-14 ENCOUNTER — Telehealth: Payer: Self-pay | Admitting: Family Medicine

## 2019-06-14 NOTE — Telephone Encounter (Signed)
Subjective: Michelle Alvarez was here for reevaluation for shortness of breath.  She was seen yesterday by Michelle Alvarez.  She does have a history of asthma for the past 10 years.  Last week she had her second dose of COVID-19 vaccine.  She started feeling short of breath a couple days ago.  Yesterday she was given a Medrol Dosepak and took her first dose.  Today she already feels pretty much back to normal.  No fevers or chills.  Objective: Blood pressure 141/64, pulse 81, oxygen saturation 100% on room air and respirations 18 and nonlabored. CV: Regular rate and rhythm without murmurs, rubs, or gallops.   Lungs: Clear to auscultation throughout with no wheezing or areas of consolidation.  Impression: Shortness of breath, resolved with Medrol Dosepak.  Plan: Finish methylprednisolone.  Follow-up if symptoms worsen.

## 2019-07-12 MED FILL — SYMBICORT 160-4.5 MCG INH: 160-4.5 | 30 days supply | Qty: 10 | Fill #2

## 2019-07-25 ENCOUNTER — Encounter: Payer: Self-pay | Admitting: Family Medicine

## 2019-07-25 DIAGNOSIS — J45909 Unspecified asthma, uncomplicated: Secondary | ICD-10-CM

## 2019-08-27 ENCOUNTER — Encounter: Payer: Self-pay | Admitting: Allergy and Immunology

## 2019-08-27 ENCOUNTER — Other Ambulatory Visit: Payer: Self-pay

## 2019-08-27 ENCOUNTER — Ambulatory Visit: Payer: No Typology Code available for payment source | Admitting: Allergy and Immunology

## 2019-08-27 ENCOUNTER — Other Ambulatory Visit: Payer: Self-pay | Admitting: Allergy and Immunology

## 2019-08-27 VITALS — BP 110/74 | HR 78 | Temp 97.9°F | Resp 16

## 2019-08-27 DIAGNOSIS — J3089 Other allergic rhinitis: Secondary | ICD-10-CM | POA: Diagnosis not present

## 2019-08-27 DIAGNOSIS — J4540 Moderate persistent asthma, uncomplicated: Secondary | ICD-10-CM

## 2019-08-27 MED ORDER — BUDESONIDE-FORMOTEROL FUMARATE 160-4.5 MCG/ACT IN AERO: INHALATION_SPRAY | RESPIRATORY_TRACT | 5 refills | Status: DC

## 2019-08-27 NOTE — Patient Instructions (Signed)
  1.  Continue Symbicort 160 -2 inhalations 1-2 a day with spacer  2.  Continue OTC Rhinocort one spray each nostril one time per day during periods of upper airway symptoms  3. Continue Proair HFA and antihistamine if needed.  4. Return to clinic in 1 year or earlier if problem

## 2019-08-27 NOTE — Progress Notes (Signed)
[facility] [place] [facility] [place]   Follow-up Note  Referring Provider: Midge Minium, MD Primary Provider: Midge Minium, MD Date of Office Visit: 08/27/2019  Subjective:   Michelle Alvarez (DOB: 12-22-1980) is a 39 y.o. female who returns to the [facility] on 08/27/2019 in re-evaluation of the following:  HPI: [name] returns to this clinic in reevaluation of asthma and allergic rhinitis.  Her last visit to this clinic was 05 June 2018.  She has had an excellent year with her asthma.  She has only required 1 systemic steroid for a very slight flareup of her asthma that appeared to be temporally related to the administration of her [place] vaccination in January 2021.  Within several days she developed problems with wheezing and coughing and having some exercise intolerance but that quickly resolved with her steroid.  She uses Symbicort for the most part just 1 time per day and occasionally twice a day should she have some increased respiratory tract symptoms.  She exercises avidly with no problem.  Cold air did not precipitate any of her issue.    Her nose has really been doing quite well while using Rhinocort on a regular basis.  She has received 2 Pfizer Covid vaccinations.  Allergies as of 08/27/2019      Reactions   Cephalexin Rash   Doxycycline Rash   Penicillins Rash   Has patient had a PCN reaction causing immediate rash, facial/tongue/throat swelling, SOB or lightheadedness with hypotension: No Has patient had a PCN reaction causing severe rash involving mucus membranes or skin necrosis: No Has patient had a PCN reaction that required hospitalization: No Has patient had a PCN reaction occurring within the last 10 years: No If all of the above answers are "NO", then may proceed with Cephalosporin use.   Vancomycin Itching      Medication List      albuterol 108 (90 Base) MCG/ACT inhaler Commonly known  as: VENTOLIN HFA Inhale 2 puffs into the lungs every 4 (four) hours as needed.   budesonide-formoterol 160-4.5 MCG/ACT inhaler Commonly known as: Symbicort INHALE 2 PUFFS BY MOUTH INTO THE LUNGS 2 TIMES DAILY.   loratadine 10 MG tablet Commonly known as: CLARITIN Take 10 mg daily by mouth.   RHINOCORT ALLERGY NA Place 1 spray into both nostrils daily as needed.       Past Medical History:  Diagnosis Date  . Asthma    PRN rescue inhaler   . Chronic sinusitis 04/2012  . Cough 05/14/2012  . Deviated nasal septum 04/2012  . Gestational diabetes    diet controlled/ self BS checks  . Hx of varicella   . Postpartum care following vaginal delivery (7/18) 12/01/2014  . 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 Stuffy and runny nose 05/14/2012   clear drainage from nose    Past Surgical History:  Procedure Laterality Date  . ARTHROSCOPIC REPAIR ACL     bilateral  . KNEE ARTHROSCOPY     right  . NASAL SEPTOPLASTY W/ TURBINOPLASTY  05/21/2012   Procedure: NASAL SEPTOPLASTY WITH TURBINATE REDUCTION;  Surgeon: Jerrell Belfast, MD;  Location: [facility];  Service: ENT;  Laterality: Bilateral;  NASAL SEPTOPLATY AND BILATERAL INFERIOR TURBINATE REDUCTION   . SINUS ENDO W/FUSION  05/21/2012   Procedure: ENDOSCOPIC SINUS SURGERY WITH FUSION NAVIGATION;  Surgeon: Jerrell Belfast, MD;  Location: [facility];  Service: ENT;  Laterality: Bilateral;  BILATERAL ENDOSCOPIC SINUS SURGERY  WITH FUSION SCAN   . WISDOM TOOTH EXTRACTION      Review of systems negative except as noted in HPI / PMHx or noted below:  Review of Systems  Constitutional: Negative.   HENT: Negative.   Eyes: Negative.   Respiratory: Negative.   Cardiovascular: Negative.   Gastrointestinal: Negative.   Genitourinary: Negative.   Musculoskeletal: Negative.   Skin: Negative.   Neurological: Negative.   Endo/Heme/Allergies: Negative.   Psychiatric/Behavioral: Negative.      Objective:     Vitals:   08/27/19 1326  BP: 110/74  Pulse: 78  Resp: 16  Temp: 97.9 F (36.6 C)  SpO2: 100%          Physical Exam Constitutional:      Appearance: She is not diaphoretic.  HENT:     Head: Normocephalic.     Right Ear: Tympanic membrane, ear canal and external ear normal.     Left Ear: Tympanic membrane, ear canal and external ear normal.     Nose: Nose normal. No mucosal edema or rhinorrhea.     Mouth/Throat:     Pharynx: Uvula midline. No oropharyngeal exudate.  Eyes:     Conjunctiva/sclera: Conjunctivae normal.  Neck:     Thyroid: No thyromegaly.     Trachea: Trachea normal. No tracheal tenderness or tracheal deviation.  Cardiovascular:     Rate and Rhythm: Normal rate and regular rhythm.     Heart sounds: Normal heart sounds, S1 normal and S2 normal. No murmur.  Pulmonary:     Effort: No respiratory distress.     Breath sounds: Normal breath sounds. No stridor. No wheezing or rales.  Lymphadenopathy:     Head:     Right side of head: No tonsillar adenopathy.     Left side of head: No tonsillar adenopathy.     Cervical: No cervical adenopathy.  Skin:    Findings: No erythema or rash.     Nails: There is no clubbing.  Neurological:     Mental Status: She is alert.     Diagnostics:    Spirometry was performed and demonstrated an FEV1 of 3.72 at 111 % of predicted.  The patient had an Asthma Control Test with the following results: ACT Total Score: 24.    Assessment and Plan:   1. Asthma, moderate persistent, well-controlled   2. Other allergic rhinitis     1.  Continue Symbicort 160 -2 inhalations 1-2 a day with spacer  2.  Continue OTC Rhinocort one spray each nostril one time per day during periods of upper airway symptoms  3. Continue Proair HFA and antihistamine if needed.  4. Return to clinic in 1 year or earlier if problem   [name] is doing very well and she understand her disease process thoroughly and understands the appropriate  dosing of her medications and we will now see her back in this clinic in 1 year while utilizing the plan noted above or earlier should there be a problem.  [name], MD Allergy / Immunology [place]

## 2019-08-28 ENCOUNTER — Encounter: Payer: Self-pay | Admitting: Allergy and Immunology

## 2019-08-29 ENCOUNTER — Other Ambulatory Visit: Payer: Self-pay | Admitting: *Deleted

## 2019-08-29 MED ORDER — BUDESONIDE-FORMOTEROL FUMARATE 160-4.5 MCG/ACT IN AERO: INHALATION_SPRAY | RESPIRATORY_TRACT | 5 refills | Status: DC

## 2019-11-15 ENCOUNTER — Telehealth: Payer: Self-pay | Admitting: Family Medicine

## 2019-11-15 MED ORDER — NABUMETONE 750 MG PO TABS: 750.0000 mg | ORAL_TABLET | Freq: Two times a day (BID) | ORAL | 6 refills | Status: DC | PRN

## 2019-11-15 MED FILL — NABUMETONE 750 MG TABS: 750 | 30 days supply | Qty: 60 | Fill #0

## 2019-11-15 NOTE — Telephone Encounter (Signed)
She is having troubles with her right knee.  She status post ACL reconstruction 18 years ago.  For the past 3 weeks it has been swollen with no injury.  Ibuprofen helps a little bit.  No locking or instability symptoms.  Examination revealed 1+ effusion with no warmth.  No significant joint line tenderness.  Ligaments feel stable.  She has fullness in the popliteal fossa.  Ultrasound imaging reveals a large popliteal cyst.  I did not record the images.  We will try Relafen.  If symptoms persist we could aspirate and possibly inject the Baker's cyst.

## 2019-12-10 ENCOUNTER — Ambulatory Visit: Payer: Self-pay

## 2019-12-10 ENCOUNTER — Other Ambulatory Visit: Payer: Self-pay | Admitting: Family Medicine

## 2019-12-10 DIAGNOSIS — G8929 Other chronic pain: Secondary | ICD-10-CM

## 2019-12-10 NOTE — Progress Notes (Signed)
Subjective: She is here for ultrasound-guided right knee Baker's cyst aspiration and injection.  Ongoing pain for the past couple months, tightness in the posterior knee.  She is status post ACL reconstruction.  Procedure: Ultrasound-guided Baker's cyst aspiration: After sterile prep with Betadine, injected 5 cc 1% lidocaine without epinephrine, then aspirated 25 cc of clear yellow synovial fluid, then injected 40 mg methylprednisolone.

## 2019-12-13 ENCOUNTER — Other Ambulatory Visit: Payer: Self-pay

## 2019-12-13 ENCOUNTER — Encounter: Payer: Self-pay | Admitting: Family Medicine

## 2019-12-13 ENCOUNTER — Ambulatory Visit (INDEPENDENT_AMBULATORY_CARE_PROVIDER_SITE_OTHER): Payer: No Typology Code available for payment source | Admitting: Family Medicine

## 2019-12-13 VITALS — BP 106/76 | HR 71 | Temp 98.0°F | Resp 16 | Ht 67.0 in | Wt 141.5 lb

## 2019-12-13 DIAGNOSIS — Z0000 Encounter for general adult medical examination without abnormal findings: Secondary | ICD-10-CM

## 2019-12-13 DIAGNOSIS — Z13220 Encounter for screening for lipoid disorders: Secondary | ICD-10-CM | POA: Diagnosis not present

## 2019-12-13 DIAGNOSIS — Z79899 Other long term (current) drug therapy: Secondary | ICD-10-CM

## 2019-12-13 NOTE — Patient Instructions (Signed)
Follow up in 1 year or as needed We'll notify you of your lab results and make any changes if needed If labs look good, we can start the medication for the nails Keep up the good work!  You look great! Call with any questions or concerns Have a great summer!!!

## 2019-12-13 NOTE — Assessment & Plan Note (Signed)
Pt's PE WNL.  UTD on GYN, immunizations.  Check labs.  Anticipatory guidance provided.  

## 2019-12-13 NOTE — Progress Notes (Signed)
   Subjective:    Patient ID: Michelle Alvarez, female    DOB: Mar 22, 1981, 39 y.o.   MRN: [id number]  HPI CPE- pap scheduled in 2 weeks.  UTD on Tdap, COVID.  Exercising regularly.    Reviewed past medical, surgical, family and social histories.  Patient Care Team    Relationship Specialty Notifications Start End  Midge Minium, MD PCP - General Family Medicine  09/09/15   Azucena Fallen, MD Consulting Physician Obstetrics and Gynecology  11/18/15       Review of Systems Patient reports no vision/ hearing changes, adenopathy,fever, weight change,  persistant/recurrent hoarseness , swallowing issues, chest pain, palpitations, edema, persistant/recurrent cough, hemoptysis, dyspnea (rest/exertional/paroxysmal nocturnal), gastrointestinal bleeding (melena, rectal bleeding), abdominal pain, significant heartburn, bowel changes, GU symptoms (dysuria, hematuria, incontinence), Gyn symptoms (abnormal  bleeding, pain),  syncope, focal weakness, memory loss, numbness & tingling, skin/hair changes, abnormal bruising or bleeding, anxiety, or depression.   + toenail fungus- all nails, would like treatment.  This visit occurred during the SARS-CoV-2 public health emergency.  Safety protocols were in place, including screening questions prior to the visit, additional usage of staff PPE, and extensive cleaning of exam room while observing appropriate contact time as indicated for disinfecting solutions.       Objective:   Physical Exam General Appearance:    Alert, cooperative, no distress, appears stated age  Head:    Normocephalic, without obvious abnormality, atraumatic  Eyes:    PERRL, conjunctiva/corneas clear, EOM's intact, fundi    benign, both eyes  Ears:    Normal TM's and external ear canals, both ears  Nose:   Deferred due to COVID  Throat:   Neck:   Supple, symmetrical, trachea midline, no adenopathy;    Thyroid: no enlargement/tenderness/nodules  Back:     Symmetric, no  curvature, ROM normal, no CVA tenderness  Lungs:     Clear to auscultation bilaterally, respirations unlabored  Chest Wall:    No tenderness or deformity   Heart:    Regular rate and rhythm, S1 and S2 normal, no murmur, rub   or gallop  Breast Exam:    Deferred to GYN  Abdomen:     Soft, non-tender, bowel sounds active all four quadrants,    no masses, no organomegaly  Genitalia:    Deferred to GYN  Rectal:    Extremities:   Extremities normal, atraumatic, no cyanosis or edema  Pulses:   2+ and symmetric all extremities  Skin:   Skin color, texture, turgor normal, no rashes or lesions  Lymph nodes:   Cervical, supraclavicular, and axillary nodes normal  Neurologic:   CNII-XII intact, normal strength, sensation and reflexes    throughout          Assessment & Plan:

## 2019-12-14 LAB — HEPATIC FUNCTION PANEL
AG Ratio: 2 (calc) (ref 1.0–2.5)
ALT: 8 U/L (ref 6–29)
AST: 11 U/L (ref 10–30)
Albumin: 4.7 g/dL (ref 3.6–5.1)
Alkaline phosphatase (APISO): 38 U/L (ref 31–125)
Bilirubin, Direct: 0.2 mg/dL (ref 0.0–0.2)
Globulin: 2.4 g/dL (calc) (ref 1.9–3.7)
Indirect Bilirubin: 0.9 mg/dL (calc) (ref 0.2–1.2)
Total Bilirubin: 1.1 mg/dL (ref 0.2–1.2)
Total Protein: 7.1 g/dL (ref 6.1–8.1)

## 2019-12-14 LAB — CBC WITH DIFFERENTIAL/PLATELET
Absolute Monocytes: 518 cells/uL (ref 200–950)
Basophils Absolute: 71 cells/uL (ref 0–200)
Basophils Relative: 1 %
Eosinophils Absolute: 149 cells/uL (ref 15–500)
Eosinophils Relative: 2.1 %
HCT: 41 % (ref 35.0–45.0)
Hemoglobin: 13.5 g/dL (ref 11.7–15.5)
Lymphs Abs: 2180 cells/uL (ref 850–3900)
MCH: 30.6 pg (ref 27.0–33.0)
MCHC: 32.9 g/dL (ref 32.0–36.0)
MCV: 93 fL (ref 80.0–100.0)
MPV: 10.3 fL (ref 7.5–12.5)
Monocytes Relative: 7.3 %
Neutro Abs: 4182 cells/uL (ref 1500–7800)
Neutrophils Relative %: 58.9 %
Platelets: 219 10*3/uL (ref 140–400)
RBC: 4.41 10*6/uL (ref 3.80–5.10)
RDW: 11.9 % (ref 11.0–15.0)
Total Lymphocyte: 30.7 %
WBC: 7.1 10*3/uL (ref 3.8–10.8)

## 2019-12-14 LAB — BASIC METABOLIC PANEL
BUN: 14 mg/dL (ref 7–25)
CO2: 28 mmol/L (ref 20–32)
Calcium: 9.3 mg/dL (ref 8.6–10.2)
Chloride: 101 mmol/L (ref 98–110)
Creat: 1.05 mg/dL (ref 0.50–1.10)
Glucose, Bld: 126 mg/dL — ABNORMAL HIGH (ref 65–99)
Potassium: 4 mmol/L (ref 3.5–5.3)
Sodium: 139 mmol/L (ref 135–146)

## 2019-12-14 LAB — LIPID PANEL
Cholesterol: 202 mg/dL — ABNORMAL HIGH (ref <200)
HDL: 97 mg/dL (ref >=50)
LDL Cholesterol (Calc): 89 mg/dL (calc)
Non-HDL Cholesterol (Calc): 105 mg/dL (calc) (ref <130)
Total CHOL/HDL Ratio: 2.1 (calc) (ref <5.0)
Triglycerides: 75 mg/dL (ref <150)

## 2019-12-14 LAB — TSH: TSH: 1.78 mIU/L

## 2019-12-16 ENCOUNTER — Encounter: Payer: Self-pay | Admitting: General Practice

## 2019-12-22 ENCOUNTER — Encounter: Payer: Self-pay | Admitting: Family Medicine

## 2019-12-23 MED ORDER — TERBINAFINE HCL 250 MG PO TABS: 250.0000 mg | ORAL_TABLET | Freq: Every day | ORAL | 0 refills | Status: DC

## 2019-12-23 MED FILL — TERBINAFINE HCL 250 MG TAB: 250 | 90 days supply | Qty: 90 | Fill #0

## 2019-12-24 LAB — HM PAP SMEAR: HM Pap smear: NORMAL

## 2019-12-24 MED FILL — SYMBICORT 160-4.5 MCG INH: 160-4.5 | 30 days supply | Qty: 10 | Fill #1

## 2020-01-23 ENCOUNTER — Encounter: Payer: Self-pay | Admitting: Family Medicine

## 2020-01-23 DIAGNOSIS — Z1283 Encounter for screening for malignant neoplasm of skin: Secondary | ICD-10-CM

## 2020-02-14 MED FILL — SYMBICORT 160-4.5 MCG INH: 160-4.5 | 30 days supply | Qty: 10 | Fill #2

## 2020-04-14 ENCOUNTER — Other Ambulatory Visit: Payer: Self-pay | Admitting: Allergy and Immunology

## 2020-04-14 MED FILL — ALBUTEROL SULFATE HFA 108 (: 108 (90 BAS | 16 days supply | Qty: 18 | Fill #0

## 2020-04-14 MED FILL — SYMBICORT 160-4.5 MCG INH: 160-4.5 | 30 days supply | Qty: 10 | Fill #3

## 2020-05-29 MED FILL — SYMBICORT 160-4.5 MCG INH: 160-4.5 | 30 days supply | Qty: 10 | Fill #4

## 2020-08-14 ENCOUNTER — Encounter: Payer: Self-pay | Admitting: Family Medicine

## 2020-08-14 ENCOUNTER — Other Ambulatory Visit: Payer: Self-pay

## 2020-08-14 DIAGNOSIS — J4530 Mild persistent asthma, uncomplicated: Secondary | ICD-10-CM

## 2020-08-18 ENCOUNTER — Other Ambulatory Visit (HOSPITAL_COMMUNITY): Payer: Self-pay

## 2020-08-27 ENCOUNTER — Other Ambulatory Visit: Payer: Self-pay | Admitting: Allergy and Immunology

## 2020-08-27 ENCOUNTER — Other Ambulatory Visit (HOSPITAL_COMMUNITY): Payer: Self-pay

## 2020-08-27 MED FILL — Albuterol Sulfate Inhal Aero 108 MCG/ACT (90MCG Base Equiv): RESPIRATORY_TRACT | 16 days supply | Qty: 18 | Fill #0 | Status: AC

## 2020-08-29 ENCOUNTER — Other Ambulatory Visit (HOSPITAL_COMMUNITY): Payer: Self-pay

## 2020-09-04 ENCOUNTER — Other Ambulatory Visit (HOSPITAL_COMMUNITY): Payer: Self-pay

## 2020-09-04 ENCOUNTER — Other Ambulatory Visit: Payer: Self-pay

## 2020-09-04 ENCOUNTER — Encounter: Payer: Self-pay | Admitting: Allergy and Immunology

## 2020-09-04 MED ORDER — BUDESONIDE-FORMOTEROL FUMARATE 160-4.5 MCG/ACT IN AERO
INHALATION_SPRAY | RESPIRATORY_TRACT | 0 refills | Status: DC
  Filled 2020-09-04: qty 10.2, 30d supply, fill #0

## 2020-09-15 ENCOUNTER — Other Ambulatory Visit: Payer: Self-pay

## 2020-09-15 ENCOUNTER — Encounter: Payer: Self-pay | Admitting: Allergy and Immunology

## 2020-09-15 ENCOUNTER — Ambulatory Visit (INDEPENDENT_AMBULATORY_CARE_PROVIDER_SITE_OTHER): Payer: No Typology Code available for payment source | Admitting: Allergy and Immunology

## 2020-09-15 DIAGNOSIS — J4540 Moderate persistent asthma, uncomplicated: Secondary | ICD-10-CM

## 2020-09-15 DIAGNOSIS — J3089 Other allergic rhinitis: Secondary | ICD-10-CM | POA: Diagnosis not present

## 2020-09-15 MED ORDER — BUDESONIDE-FORMOTEROL FUMARATE 160-4.5 MCG/ACT IN AERO
INHALATION_SPRAY | RESPIRATORY_TRACT | 11 refills | Status: DC
  Filled 2020-09-15: qty 10.2, fill #0
  Filled 2020-10-23: qty 10.2, 30d supply, fill #0
  Filled 2020-11-29: qty 10.2, 30d supply, fill #1
  Filled 2021-01-27: qty 10.2, 30d supply, fill #2
  Filled 2021-03-17: qty 10.2, 30d supply, fill #3
  Filled 2021-04-26: qty 10.2, 30d supply, fill #4
  Filled 2021-06-17: qty 10.2, 30d supply, fill #5
  Filled 2021-08-05: qty 10.2, 30d supply, fill #6

## 2020-09-15 MED ORDER — ALBUTEROL SULFATE HFA 108 (90 BASE) MCG/ACT IN AERS
INHALATION_SPRAY | RESPIRATORY_TRACT | 6 refills | Status: DC
  Filled 2020-09-15 – 2020-11-26 (×2): qty 18, 16d supply, fill #0
  Filled 2021-03-17: qty 18, 16d supply, fill #1

## 2020-09-15 NOTE — Patient Instructions (Signed)
  1.  Continue Symbicort 160 -2 inhalations 1-2 a day with spacer  2.  Continue OTC Rhinocort one spray each nostril one time per day during periods of upper airway symptoms  3. Continue Proair HFA and antihistamine if needed.  4. Return to clinic in 1 year or earlier if problem  

## 2020-09-15 NOTE — Progress Notes (Signed)
[place] [place] [facility] [place]   Follow-up Note   Referring Provider: Midge Minium, MD Primary Provider: Midge Minium, MD Date of Office Visit: 09/15/2020  Subjective:   Michelle Alvarez (DOB: 05/26/1980) is a 40 y.o. female who returns to the [facility] on 09/15/2020 in re-evaluation of the following:  HPI: Michelle Alvarez returns to this clinic in reevaluation of asthma and allergic rhinitis.  Her last visit to this clinic was 27 August 2019.  She has done well since her last visit without a need for systemic steroid or antibiotic for any type of airway issue.  She has been able to exercise without much problem while using her Symbicort mostly 1 time per day and rarely using a short acting bronchodilator prior to exercise.  She has been using a spin bike and some strength exercise.  She had very little problems with her nose while she continues on a nasal steroid.  She has received 3 [place] vaccines.  Allergies as of 09/15/2020      Reactions   Cephalexin Rash   Doxycycline Rash   Penicillins Rash   Has patient had a PCN reaction causing immediate rash, facial/tongue/throat swelling, SOB or lightheadedness with hypotension: No Has patient had a PCN reaction causing severe rash involving mucus membranes or skin necrosis: No Has patient had a PCN reaction that required hospitalization: No Has patient had a PCN reaction occurring within the last 10 years: No If all of the above answers are "NO", then may proceed with Cephalosporin use.   Vancomycin Itching      Medication List      albuterol 108 (90 Base) MCG/ACT inhaler Commonly known as: VENTOLIN HFA INHALE 2 PUFFS BY MOUTH INTO THE LUNGS EVERY 4 HOURS AS NEEDED   loratadine 10 MG tablet Commonly known as: CLARITIN Take 10 mg daily by mouth.   RHINOCORT ALLERGY NA Place 1 spray into both nostrils daily as needed.   Symbicort 160-4.5 MCG/ACT inhaler Generic  drug: budesonide-formoterol INHALE 2 PUFFS BY MOUTH INTO THE LUNGS 2 TIMES DAILY.   terbinafine 250 MG tablet Commonly known as: LamISIL Take 1 tablet (250 mg total) by mouth daily.       Past Medical History:  Diagnosis Date  . Asthma    PRN rescue inhaler   . Chronic sinusitis 04/2012  . Cough 05/14/2012  . Deviated nasal septum 04/2012  . Gestational diabetes    diet controlled/ self BS checks  . Hx of varicella   . Postpartum care following vaginal delivery (7/18) 12/01/2014  . 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 Stuffy and runny nose 05/14/2012   clear drainage from nose    Past Surgical History:  Procedure Laterality Date  . ARTHROSCOPIC REPAIR ACL     bilateral  . KNEE ARTHROSCOPY     right  . NASAL SEPTOPLASTY W/ TURBINOPLASTY  05/21/2012   Procedure: NASAL SEPTOPLASTY WITH TURBINATE REDUCTION;  Surgeon: Jerrell Belfast, MD;  Location: [facility];  Service: ENT;  Laterality: Bilateral;  NASAL SEPTOPLATY AND BILATERAL INFERIOR TURBINATE REDUCTION   . SINUS ENDO W/FUSION  05/21/2012   Procedure: ENDOSCOPIC SINUS SURGERY WITH FUSION NAVIGATION;  Surgeon: Jerrell Belfast, MD;  Location: [facility];  Service: ENT;  Laterality: Bilateral;  BILATERAL ENDOSCOPIC SINUS SURGERY  WITH FUSION SCAN   . WISDOM TOOTH EXTRACTION      Review of systems negative except as noted in  HPI / PMHx or noted below:  Review of Systems  Constitutional: Negative.   HENT: Negative.   Eyes: Negative.   Respiratory: Negative.   Cardiovascular: Negative.   Gastrointestinal: Negative.   Genitourinary: Negative.   Musculoskeletal: Negative.   Skin: Negative.   Neurological: Negative.   Endo/Heme/Allergies: Negative.   Psychiatric/Behavioral: Negative.      Objective:   Vitals:   09/15/20 1601  BP: 112/78  Pulse: 73  Resp: 18  Temp: 98.1 F (36.7 C)  SpO2: 97%   Height: 5\' 7"  (170.2 cm)  Weight: 142 lb (64.4 kg)   Physical  Exam Constitutional:      Appearance: She is not diaphoretic.  HENT:     Head: Normocephalic.     Right Ear: Tympanic membrane, ear canal and external ear normal.     Left Ear: Tympanic membrane, ear canal and external ear normal.     Nose: Nose normal. No mucosal edema or rhinorrhea.     Mouth/Throat:     Pharynx: Uvula midline. No oropharyngeal exudate.  Eyes:     Conjunctiva/sclera: Conjunctivae normal.  Neck:     Thyroid: No thyromegaly.     Trachea: Trachea normal. No tracheal tenderness or tracheal deviation.  Cardiovascular:     Rate and Rhythm: Normal rate and regular rhythm.     Heart sounds: Normal heart sounds, S1 normal and S2 normal. No murmur heard.   Pulmonary:     Effort: No respiratory distress.     Breath sounds: Normal breath sounds. No stridor. No wheezing or rales.  Lymphadenopathy:     Head:     Right side of head: No tonsillar adenopathy.     Left side of head: No tonsillar adenopathy.     Cervical: No cervical adenopathy.  Skin:    Findings: No erythema or rash.     Nails: There is no clubbing.  Neurological:     Mental Status: She is alert.     Diagnostics:    Spirometry was performed and demonstrated an FEV1 of 3.11 at 93 % of predicted.  Assessment and Plan:   1. Asthma, moderate persistent, well-controlled   2. Other allergic rhinitis     1.  Continue Symbicort 160 -2 inhalations 1-2 a day with spacer  2.  Continue OTC Rhinocort one spray each nostril one time per day during periods of upper airway symptoms  3. Continue Proair HFA and antihistamine if needed.  4. Return to clinic in 1 year or earlier if problem   Michelle Alvarez is really doing very well on her plan.  She has a very good understanding of her medications and how they work and appropriate dosing of her medications depending on disease activity.  Assuming she does well with this plan I will see her back in this clinic in 1 year or earlier if there is a problem.  [name],  MD Allergy / Immunology [place]

## 2020-09-16 ENCOUNTER — Other Ambulatory Visit (HOSPITAL_COMMUNITY): Payer: Self-pay

## 2020-09-16 ENCOUNTER — Encounter: Payer: Self-pay | Admitting: Allergy and Immunology

## 2020-09-24 ENCOUNTER — Other Ambulatory Visit (HOSPITAL_COMMUNITY): Payer: Self-pay

## 2020-10-22 ENCOUNTER — Other Ambulatory Visit (HOSPITAL_COMMUNITY): Payer: Self-pay

## 2020-10-22 ENCOUNTER — Other Ambulatory Visit: Payer: Self-pay | Admitting: Allergy and Immunology

## 2020-10-23 ENCOUNTER — Other Ambulatory Visit (HOSPITAL_COMMUNITY): Payer: Self-pay

## 2020-11-11 ENCOUNTER — Encounter: Payer: Self-pay | Admitting: *Deleted

## 2020-11-27 ENCOUNTER — Other Ambulatory Visit (HOSPITAL_COMMUNITY): Payer: Self-pay

## 2020-11-30 ENCOUNTER — Other Ambulatory Visit (HOSPITAL_COMMUNITY): Payer: Self-pay

## 2021-01-27 ENCOUNTER — Other Ambulatory Visit (HOSPITAL_COMMUNITY): Payer: Self-pay

## 2021-02-12 ENCOUNTER — Other Ambulatory Visit (HOSPITAL_BASED_OUTPATIENT_CLINIC_OR_DEPARTMENT_OTHER): Payer: Self-pay

## 2021-02-12 MED ORDER — COVID-19 AT HOME ANTIGEN TEST VI KIT
PACK | 0 refills | Status: DC
  Filled 2021-02-12: qty 2, 4d supply, fill #0

## 2021-02-22 ENCOUNTER — Encounter: Payer: Self-pay | Admitting: Family Medicine

## 2021-02-22 NOTE — Telephone Encounter (Signed)
Please advise on referral

## 2021-02-23 ENCOUNTER — Other Ambulatory Visit: Payer: Self-pay

## 2021-02-23 DIAGNOSIS — Z1283 Encounter for screening for malignant neoplasm of skin: Secondary | ICD-10-CM

## 2021-03-17 ENCOUNTER — Other Ambulatory Visit (HOSPITAL_BASED_OUTPATIENT_CLINIC_OR_DEPARTMENT_OTHER): Payer: Self-pay

## 2021-03-30 ENCOUNTER — Encounter: Payer: Self-pay | Admitting: Allergy and Immunology

## 2021-03-31 ENCOUNTER — Other Ambulatory Visit (HOSPITAL_BASED_OUTPATIENT_CLINIC_OR_DEPARTMENT_OTHER): Payer: Self-pay

## 2021-03-31 ENCOUNTER — Other Ambulatory Visit: Payer: Self-pay

## 2021-03-31 MED ORDER — PREDNISONE 10 MG PO TABS
10.0000 mg | ORAL_TABLET | Freq: Every day | ORAL | 0 refills | Status: AC
  Filled 2021-03-31: qty 10, 10d supply, fill #0

## 2021-03-31 NOTE — Progress Notes (Signed)
Per Dr. Rockey Situ message "Please have Michelle Alvarez try prednisone 10 mg - 1 tablet 1 time per day for 10 days and keep [place] appraised of her response to this treatment."

## 2021-04-01 ENCOUNTER — Other Ambulatory Visit (HOSPITAL_BASED_OUTPATIENT_CLINIC_OR_DEPARTMENT_OTHER): Payer: Self-pay

## 2021-04-26 ENCOUNTER — Other Ambulatory Visit (HOSPITAL_BASED_OUTPATIENT_CLINIC_OR_DEPARTMENT_OTHER): Payer: Self-pay

## 2021-06-01 ENCOUNTER — Encounter: Payer: No Typology Code available for payment source | Admitting: Family Medicine

## 2021-06-04 ENCOUNTER — Encounter: Payer: Self-pay | Admitting: Family Medicine

## 2021-06-04 ENCOUNTER — Ambulatory Visit (INDEPENDENT_AMBULATORY_CARE_PROVIDER_SITE_OTHER): Payer: No Typology Code available for payment source | Admitting: Family Medicine

## 2021-06-04 VITALS — BP 118/80 | HR 64 | Temp 98.1°F | Resp 16 | Ht 67.0 in | Wt 140.0 lb

## 2021-06-04 DIAGNOSIS — Z0000 Encounter for general adult medical examination without abnormal findings: Secondary | ICD-10-CM

## 2021-06-04 DIAGNOSIS — R7309 Other abnormal glucose: Secondary | ICD-10-CM

## 2021-06-04 LAB — BASIC METABOLIC PANEL
BUN: 14 mg/dL (ref 6–23)
CO2: 30 mEq/L (ref 19–32)
Calcium: 9.2 mg/dL (ref 8.4–10.5)
Chloride: 101 mEq/L (ref 96–112)
Creatinine, Ser: 0.86 mg/dL (ref 0.40–1.20)
GFR: 84.97 mL/min (ref >60.00)
Glucose, Bld: 89 mg/dL (ref 70–99)
Potassium: 3.9 mEq/L (ref 3.5–5.1)
Sodium: 138 mEq/L (ref 135–145)

## 2021-06-04 LAB — HEPATIC FUNCTION PANEL
ALT: 7 U/L (ref 0–35)
AST: 10 U/L (ref 0–37)
Albumin: 4.6 g/dL (ref 3.5–5.2)
Alkaline Phosphatase: 39 U/L (ref 39–117)
Bilirubin, Direct: 0.2 mg/dL (ref 0.0–0.3)
Total Bilirubin: 1 mg/dL (ref 0.2–1.2)
Total Protein: 7 g/dL (ref 6.0–8.3)

## 2021-06-04 LAB — CBC WITH DIFFERENTIAL/PLATELET
Basophils Absolute: 0 10*3/uL (ref 0.0–0.1)
Basophils Relative: 0.8 % (ref 0.0–3.0)
Eosinophils Absolute: 0.2 10*3/uL (ref 0.0–0.7)
Eosinophils Relative: 4 % (ref 0.0–5.0)
HCT: 38.5 % (ref 36.0–46.0)
Hemoglobin: 12.9 g/dL (ref 12.0–15.0)
Lymphocytes Relative: 34.4 % (ref 12.0–46.0)
Lymphs Abs: 2.1 10*3/uL (ref 0.7–4.0)
MCHC: 33.6 g/dL (ref 30.0–36.0)
MCV: 89.9 fl (ref 78.0–100.0)
Monocytes Absolute: 0.3 10*3/uL (ref 0.1–1.0)
Monocytes Relative: 5.6 % (ref 3.0–12.0)
Neutro Abs: 3.4 10*3/uL (ref 1.4–7.7)
Neutrophils Relative %: 55.2 % (ref 43.0–77.0)
Platelets: 240 10*3/uL (ref 150.0–400.0)
RBC: 4.28 Mil/uL (ref 3.87–5.11)
RDW: 13.3 % (ref 11.5–15.5)
WBC: 6.1 10*3/uL (ref 4.0–10.5)

## 2021-06-04 LAB — LIPID PANEL
Cholesterol: 179 mg/dL (ref 0–200)
HDL: 72.9 mg/dL (ref >39.00)
LDL Cholesterol: 93 mg/dL (ref 0–99)
NonHDL: 106.3
Total CHOL/HDL Ratio: 2
Triglycerides: 68 mg/dL (ref 0.0–149.0)
VLDL: 13.6 mg/dL (ref 0.0–40.0)

## 2021-06-04 LAB — TSH: TSH: 1.81 u[IU]/mL (ref 0.35–5.50)

## 2021-06-04 NOTE — Patient Instructions (Signed)
Follow up in 1 year or as needed °We'll notify you of your lab results and make any changes if needed °Keep up the good work!  You look great!! °Call with any questions or concerns °Stay Safe!  Stay Healthy! °Happy New Year!!! °

## 2021-06-04 NOTE — Assessment & Plan Note (Signed)
Pt's PE WNL.  UTD on pap, Tdap, flu, COVID.  Check labs.  Anticipatory guidance provided.

## 2021-06-04 NOTE — Progress Notes (Signed)
° °  Subjective:    Patient ID: Michelle Alvarez, female    DOB: [birth year], 41 y.o.   MRN: [id number]  HPI CPE- UTD on pap, Tdap, flu.  No concerns today  Patient Care Team    Relationship Specialty Notifications Start End  Midge Minium, MD PCP - General Family Medicine  09/09/15   Azucena Fallen, MD Consulting Physician Obstetrics and Gynecology  11/18/15     Health Maintenance  Topic Date Due   Hepatitis C Screening  Never done   PAP SMEAR-Modifier  03/04/2019   COVID-19 Vaccine (3 - Pfizer risk series) 07/02/2019   TETANUS/TDAP  03/03/2025   INFLUENZA VACCINE  Completed   HIV Screening  Completed   HPV VACCINES  Aged Out      Review of Systems Patient reports no vision/ hearing changes, adenopathy,fever, weight change,  persistant/recurrent hoarseness , swallowing issues, chest pain, palpitations, edema, persistant/recurrent cough, hemoptysis, dyspnea (rest/exertional/paroxysmal nocturnal), gastrointestinal bleeding (melena, rectal bleeding), abdominal pain, significant heartburn, bowel changes, GU symptoms (dysuria, hematuria, incontinence), Gyn symptoms (abnormal  bleeding, pain),  syncope, focal weakness, memory loss, numbness & tingling, skin/hair/nail changes, abnormal bruising or bleeding, anxiety, or depression.   This visit occurred during the SARS-CoV-2 public health emergency.  Safety protocols were in place, including screening questions prior to the visit, additional usage of staff PPE, and extensive cleaning of exam room while observing appropriate contact time as indicated for disinfecting solutions.      Objective:   Physical Exam General Appearance:    Alert, cooperative, no distress, appears stated age  Head:    Normocephalic, without obvious abnormality, atraumatic  Eyes:    PERRL, conjunctiva/corneas clear, EOM's intact, fundi    benign, both eyes  Ears:    Normal TM's and external ear canals, both ears  Nose:   Deferred due to COVID  Throat:   Neck:    Supple, symmetrical, trachea midline, no adenopathy;    Thyroid: no enlargement/tenderness/nodules  Back:     Symmetric, no curvature, ROM normal, no CVA tenderness  Lungs:     Clear to auscultation bilaterally, respirations unlabored  Chest Wall:    No tenderness or deformity   Heart:    Regular rate and rhythm, S1 and S2 normal, no murmur, rub   or gallop  Breast Exam:    Deferred to GYN  Abdomen:     Soft, non-tender, bowel sounds active all four quadrants,    no masses, no organomegaly  Genitalia:    Deferred to GYN  Rectal:    Extremities:   Extremities normal, atraumatic, no cyanosis or edema  Pulses:   2+ and symmetric all extremities  Skin:   Skin color, texture, turgor normal, no rashes or lesions  Lymph nodes:   Cervical, supraclavicular, and axillary nodes normal  Neurologic:   CNII-XII intact, normal strength, sensation and reflexes    throughout          Assessment & Plan:

## 2021-06-17 ENCOUNTER — Other Ambulatory Visit (HOSPITAL_BASED_OUTPATIENT_CLINIC_OR_DEPARTMENT_OTHER): Payer: Self-pay

## 2021-06-17 ENCOUNTER — Encounter: Payer: Self-pay | Admitting: Family Medicine

## 2021-07-27 ENCOUNTER — Other Ambulatory Visit (HOSPITAL_BASED_OUTPATIENT_CLINIC_OR_DEPARTMENT_OTHER): Payer: Self-pay

## 2021-08-05 ENCOUNTER — Other Ambulatory Visit (HOSPITAL_BASED_OUTPATIENT_CLINIC_OR_DEPARTMENT_OTHER): Payer: Self-pay

## 2021-12-20 ENCOUNTER — Encounter: Payer: Self-pay | Admitting: Family Medicine

## 2021-12-20 ENCOUNTER — Other Ambulatory Visit (HOSPITAL_COMMUNITY): Payer: Self-pay

## 2021-12-20 MED ORDER — ALBUTEROL SULFATE HFA 108 (90 BASE) MCG/ACT IN AERS
INHALATION_SPRAY | RESPIRATORY_TRACT | 6 refills | Status: DC
  Filled 2021-12-20: qty 20.1, 16d supply, fill #0
  Filled 2022-07-27: qty 20.1, 51d supply, fill #1

## 2021-12-20 MED ORDER — BUDESONIDE-FORMOTEROL FUMARATE 160-4.5 MCG/ACT IN AERO
INHALATION_SPRAY | RESPIRATORY_TRACT | 11 refills | Status: DC
  Filled 2021-12-20: qty 10.2, 30d supply, fill #0
  Filled 2022-03-28: qty 10.2, 30d supply, fill #1

## 2022-03-29 ENCOUNTER — Encounter (INDEPENDENT_AMBULATORY_CARE_PROVIDER_SITE_OTHER): Payer: No Typology Code available for payment source | Admitting: Family Medicine

## 2022-03-29 ENCOUNTER — Other Ambulatory Visit (HOSPITAL_COMMUNITY): Payer: Self-pay

## 2022-03-29 DIAGNOSIS — J4530 Mild persistent asthma, uncomplicated: Secondary | ICD-10-CM

## 2022-03-29 MED ORDER — PREDNISONE 10 MG PO TABS
ORAL_TABLET | ORAL | 0 refills | Status: AC
  Filled 2022-03-29: qty 18, 9d supply, fill #0

## 2022-03-29 NOTE — Telephone Encounter (Signed)
[facility] VISIT   Patient agreed to [facility] visit and is aware that copayment and coinsurance may apply. Patient was treated using telemedicine according to accepted telemedicine protocols.  Subjective:   Patient complains of asthma/wheezing w/ exercise  Patient Active Problem List   Diagnosis Date Noted   Mild persistent asthma 01/24/2015   Deviated nasal septum 05/21/2012    Class: Chronic   Toenail fungus 11/18/2011   Allergic rhinitis 06/29/2011   Physical exam 06/29/2011   Social History   Tobacco Use   Smoking status: Never   Smokeless tobacco: Never  Substance Use Topics   Alcohol use: Yes    Comment: occasionally    Current Outpatient Medications:    predniSONE (DELTASONE) 10 MG tablet, 3 tabs x3 days and then 2 tabs x3 days and then 1 tab x3 days.  Take w/ food., Disp: 18 tablet, Rfl: 0   albuterol (VENTOLIN HFA) 108 (90 Base) MCG/ACT inhaler, INHALE 2 PUFFS BY MOUTH INTO THE LUNGS EVERY 4 HOURS AS NEEDED, Disp: 18 g, Rfl: 6   Budesonide (RHINOCORT ALLERGY NA), Place 1 spray into both nostrils daily as needed., Disp: , Rfl:    budesonide-formoterol (SYMBICORT) 160-4.5 MCG/ACT inhaler, INHALE 2 PUFFS BY MOUTH INTO THE LUNGS 2 TIMES DAILY., Disp: 10.2 g, Rfl: 11   loratadine (CLARITIN) 10 MG tablet, Take 10 mg daily by mouth., Disp: , Rfl:   Allergies  Allergen Reactions   Cephalexin Rash   Doxycycline Rash   Penicillins Rash    Has patient had a PCN reaction causing immediate rash, facial/tongue/throat swelling, SOB or lightheadedness with hypotension: No Has patient had a PCN reaction causing severe rash involving mucus membranes or skin necrosis: No Has patient had a PCN reaction that required hospitalization: No Has patient had a PCN reaction occurring within the last 10 years: No If all of the above answers are "NO", then may proceed with Cephalosporin use.    Vancomycin Itching    Assessment and Plan:   Diagnosis: Asthma. Please see myChart communication  and orders below.   No orders of the defined types were placed in this encounter.  Meds ordered this encounter  Medications   predniSONE (DELTASONE) 10 MG tablet    Sig: 3 tabs x3 days and then 2 tabs x3 days and then 1 tab x3 days.  Take w/ food.    Dispense:  18 tablet    Refill:  0    Annye Asa, MD 03/29/2022  A total of 5 minutes were spent by me to personally review the patient-generated inquiry, review patient records and data pertinent to assessment of the patient's problem, develop a management plan including generation of prescriptions and/or orders, and on subsequent communication with the patient through secure the MyChart portal service.   There is no separately reported E/M service related to this service in the past 7 days nor does the patient have an upcoming soonest available appointment for this issue. This work was completed in less than 7 days.   The patient consented to this service today (see patient agreement prior to ongoing communication). Patient counseled regarding the need for in-person exam for certain conditions and was advised to call the office if any changing or worsening symptoms occur.   The codes to be used for the E/M service are: '[x]'$   99421 for 5-10 minutes of time spent on the inquiry. '[]'$   [id number] for 11-20 minutes. '[]'$   X700321 for 21+ minutes.

## 2022-04-02 ENCOUNTER — Telehealth: Payer: No Typology Code available for payment source | Admitting: Nurse Practitioner

## 2022-04-02 DIAGNOSIS — J4530 Mild persistent asthma, uncomplicated: Secondary | ICD-10-CM | POA: Diagnosis not present

## 2022-04-02 MED ORDER — AZITHROMYCIN 250 MG PO TABS: ORAL_TABLET | ORAL | 0 refills | Status: DC

## 2022-04-02 MED ORDER — PREDNISONE 10 MG (21) PO TBPK: ORAL_TABLET | ORAL | 0 refills | Status: DC

## 2022-04-02 NOTE — Progress Notes (Signed)
Virtual Visit Consent   Michelle Alvarez, you are scheduled for a virtual visit with Mary-Margaret Hassell Done, [place], a [facility] provider, today.     Just as with appointments in the office, your consent must be obtained to participate.  Your consent will be active for this visit and any virtual visit you may have with one of our providers in the next 365 days.     If you have a MyChart account, a copy of this consent can be sent to you electronically.  All virtual visits are billed to your insurance company just like a traditional visit in the office.    As this is a virtual visit, video technology does not allow for your provider to perform a traditional examination.  This may limit your provider's ability to fully assess your condition.  If your provider identifies any concerns that need to be evaluated in person or the need to arrange testing (such as labs, EKG, etc.), we will make arrangements to do so.     Although advances in technology are sophisticated, we cannot ensure that it will always work on either your end or our end.  If the connection with a video visit is poor, the visit may have to be switched to a telephone visit.  With either a video or telephone visit, we are not always able to ensure that we have a secure connection.     I need to obtain your verbal consent now.   Are you willing to proceed with your visit today? YES   Michelle Alvarez has provided verbal consent on 04/02/2022 for a virtual visit (video or telephone).   Mary-Margaret Hassell Done, FNP   Date: 04/02/2022 6:06 PM   Virtual Visit via Video Note   I, Mary-Margaret Hassell Done, connected with Michelle Alvarez ([id number], 1980/09/15) on 04/02/22 at  7:15 PM EST by a video-enabled telemedicine application and verified that I am speaking with the correct person using two identifiers.  Location: Patient: Virtual Visit Location Patient: Home Provider: Virtual Visit Location Provider: Mobile   I discussed  the limitations of evaluation and management by telemedicine and the availability of in person appointments. The patient expressed understanding and agreed to proceed.    History of Present Illness: Michelle Alvarez is a 41 y.o. who identifies as a female who was assigned female at birth, and is being seen today for asthma.   HPI: Has been having asthma flareup for the last 6 weeks. Started on prednisone 5 days ago but is not helping. Prednisone '10mg'$  3 daily for 3 days then taper down. She has been using her symbicort BID and that is not helping either.    Review of Systems  Constitutional:  Negative for fever.  Respiratory:  Positive for cough, shortness of breath and wheezing.     Problems:  Patient Active Problem List   Diagnosis Date Noted   Mild persistent asthma 01/24/2015   Deviated nasal septum 05/21/2012    Class: Chronic   Toenail fungus 11/18/2011   Allergic rhinitis 06/29/2011   Physical exam 06/29/2011    Allergies:  Allergies  Allergen Reactions   Cephalexin Rash   Doxycycline Rash   Penicillins Rash    Has patient had a PCN reaction causing immediate rash, facial/tongue/throat swelling, SOB or lightheadedness with hypotension: No Has patient had a PCN reaction causing severe rash involving mucus membranes or skin necrosis: No Has patient had a PCN reaction that required hospitalization: No Has patient had a PCN  reaction occurring within the last 10 years: No If all of the above answers are "NO", then may proceed with Cephalosporin use.    Vancomycin Itching   Medications:  Current Outpatient Medications:    albuterol (VENTOLIN HFA) 108 (90 Base) MCG/ACT inhaler, INHALE 2 PUFFS BY MOUTH INTO THE LUNGS EVERY 4 HOURS AS NEEDED, Disp: 18 g, Rfl: 6   Budesonide (RHINOCORT ALLERGY NA), Place 1 spray into both nostrils daily as needed., Disp: , Rfl:    budesonide-formoterol (SYMBICORT) 160-4.5 MCG/ACT inhaler, INHALE 2 PUFFS BY MOUTH INTO THE LUNGS 2 TIMES  DAILY., Disp: 10.2 g, Rfl: 11   loratadine (CLARITIN) 10 MG tablet, Take 10 mg daily by mouth., Disp: , Rfl:    predniSONE (DELTASONE) 10 MG tablet, Take 3 tablets by mouth daily for 3 days, THEN 2 tablets daily for 3 days, THEN 1 tablet daily for 3 days with food, Disp: 18 tablet, Rfl: 0  Observations/Objective: Patient is well-developed, well-nourished in no acute distress.  Resting comfortably  at home.  Head is normocephalic, atraumatic.  No labored breathing.   Speech is clear and coherent with logical content.  Patient is alert and oriented at baseline.  Hoarse Wheezing exp audible  Assessment and Plan:  Michelle Alvarez in today with chief complaint of Asthma   1. Mild persistent asthma without complication Dont think steroid dose was strong enough Take meds as prescribed' will do zithromax to possiblly prevent pneumonia Continue symbicort and rescue inhaler as needed  Meds ordered this encounter  Medications   predniSONE (STERAPRED UNI-PAK 21 TAB) 10 MG (21) TBPK tablet    Sig: As directed x 6 days    Dispense:  21 tablet    Refill:  0    Order Specific Question:   Supervising Provider    Answer:   Chase Picket [[id number]]   azithromycin (ZITHROMAX Z-PAK) 250 MG tablet    Sig: As directed    Dispense:  6 tablet    Refill:  0    Order Specific Question:   Supervising Provider    Answer:   Chase Picket [[id number]]      Follow Up Instructions: I discussed the assessment and treatment plan with the patient. The patient was provided an opportunity to ask questions and all were answered. The patient agreed with the plan and demonstrated an understanding of the instructions.  A copy of instructions were sent to the patient via MyChart.  The patient was advised to call back or seek an in-person evaluation if the symptoms worsen or if the condition fails to improve as anticipated.  Time:  I spent 12 minutes with the patient via telehealth technology  discussing the above problems/concerns.    Mary-Margaret Hassell Done, FNP

## 2022-04-02 NOTE — Patient Instructions (Signed)
  Laureen Abrahams, thank you for joining Chevis Pretty, FNP for today's virtual visit.  While this provider is not your primary care provider (PCP), if your PCP is located in our provider database this encounter information will be shared with them immediately following your visit.   [facility] account gives you access to today's visit and all your visits, tests, and labs performed at [facility] " click here if you don't have a [facility] account or go to [email or web address]  Consent: (Patient) Michelle Alvarez provided verbal consent for this virtual visit at the beginning of the encounter.  Current Medications:  Current Outpatient Medications:    azithromycin (ZITHROMAX Z-PAK) 250 MG tablet, As directed, Disp: 6 tablet, Rfl: 0   predniSONE (STERAPRED UNI-PAK 21 TAB) 10 MG (21) TBPK tablet, As directed x 6 days, Disp: 21 tablet, Rfl: 0   albuterol (VENTOLIN HFA) 108 (90 Base) MCG/ACT inhaler, INHALE 2 PUFFS BY MOUTH INTO THE LUNGS EVERY 4 HOURS AS NEEDED, Disp: 18 g, Rfl: 6   Budesonide (RHINOCORT ALLERGY NA), Place 1 spray into both nostrils daily as needed., Disp: , Rfl:    budesonide-formoterol (SYMBICORT) 160-4.5 MCG/ACT inhaler, INHALE 2 PUFFS BY MOUTH INTO THE LUNGS 2 TIMES DAILY., Disp: 10.2 g, Rfl: 11   loratadine (CLARITIN) 10 MG tablet, Take 10 mg daily by mouth., Disp: , Rfl:    predniSONE (DELTASONE) 10 MG tablet, Take 3 tablets by mouth daily for 3 days, THEN 2 tablets daily for 3 days, THEN 1 tablet daily for 3 days with food, Disp: 18 tablet, Rfl: 0   Medications ordered in this encounter:  Meds ordered this encounter  Medications   predniSONE (STERAPRED UNI-PAK 21 TAB) 10 MG (21) TBPK tablet    Sig: As directed x 6 days    Dispense:  21 tablet    Refill:  0    Order Specific Question:   Supervising Provider    Answer:   Chase Picket [[id number]]   azithromycin (ZITHROMAX Z-PAK) 250 MG tablet    Sig: As directed     Dispense:  6 tablet    Refill:  0    Order Specific Question:   Supervising Provider    Answer:   Chase Picket [id number]     *If you need refills on other medications prior to your next appointment, please contact your pharmacy*  Follow-Up: Call back or seek an in-person evaluation if the symptoms worsen or if the condition fails to improve as anticipated.  [facility] [phone]  Other Instructions Continue symbicort bid Rescue inhaler as needed   If you have been instructed to have an in-person evaluation today at a local Urgent Care facility, please use the link below. It will take you to a list of all of our available [facility], including address, phone number and hours of operation. Please do not delay care.  [facility]  If you or a family member do not have a primary care provider, use the link below to schedule a visit and establish care. When you choose a [facility] primary care physician or advanced practice provider, you gain a long-term partner in health. Find a Primary Care Provider  Learn more about [facility] in-office and virtual care options: [facility]

## 2022-04-12 ENCOUNTER — Encounter: Payer: Self-pay | Admitting: Family Medicine

## 2022-04-12 ENCOUNTER — Ambulatory Visit (INDEPENDENT_AMBULATORY_CARE_PROVIDER_SITE_OTHER): Payer: No Typology Code available for payment source | Admitting: Family Medicine

## 2022-04-12 ENCOUNTER — Other Ambulatory Visit (HOSPITAL_BASED_OUTPATIENT_CLINIC_OR_DEPARTMENT_OTHER): Payer: Self-pay

## 2022-04-12 VITALS — BP 120/76 | HR 85 | Temp 97.7°F | Resp 17 | Ht 67.0 in | Wt 139.1 lb

## 2022-04-12 DIAGNOSIS — J189 Pneumonia, unspecified organism: Secondary | ICD-10-CM

## 2022-04-12 MED ORDER — LEVOFLOXACIN 500 MG PO TABS
500.0000 mg | ORAL_TABLET | Freq: Every day | ORAL | 0 refills | Status: AC
  Filled 2022-04-12: qty 7, 7d supply, fill #0

## 2022-04-12 NOTE — Patient Instructions (Signed)
Follow up as needed or as scheduled START the Levaquin daily x7 days- take w/ food Continue the Symbicort daily and ADD Albuterol to help w/ airway inflammation Call with any questions or concerns Hang in there!!!

## 2022-04-12 NOTE — Progress Notes (Signed)
   Subjective:    Patient ID: Michelle Alvarez, female    DOB: 19-Sep-1980, 41 y.o.   MRN: [id number]  HPI Asthma- pt had a video visit on 11/18 and was prescribed Prednisone taper and Zpack.  Pt reports she has finished Prednisone and Zpack.  Continues to have cough w/ deep breath.  No fevers.  Denies SOB.  Is using Symbicort but not Albuterol.     Review of Systems For ROS see HPI     Objective:   Physical Exam Vitals reviewed.  Constitutional:      General: She is not in acute distress.    Appearance: Normal appearance. She is well-developed. She is not ill-appearing.  HENT:     Head: Normocephalic and atraumatic.     Nose: No congestion or rhinorrhea.  Eyes:     Conjunctiva/sclera: Conjunctivae normal.     Pupils: Pupils are equal, round, and reactive to light.  Cardiovascular:     Rate and Rhythm: Normal rate and regular rhythm.     Heart sounds: Normal heart sounds. No murmur heard. Pulmonary:     Effort: Pulmonary effort is normal. No respiratory distress.     Breath sounds: No wheezing.     Comments: Coarse BS over RUL Musculoskeletal:     Cervical back: Normal range of motion and neck supple.  Lymphadenopathy:     Cervical: No cervical adenopathy.  Skin:    General: Skin is warm and dry.  Neurological:     General: No focal deficit present.     Mental Status: She is alert and oriented to person, place, and time.           Assessment & Plan:  CAP- new.  Pt has had cough and worsening shortness of breath over the last 3 weeks.  Completed 2 courses of Prednisone and a Zpack.  Continues to have chest tightness and cough w/ inspiration.  Denies SOB or fever.  Given her underlying asthma and multiple allergies, will tx w/ Levaquin.  Reviewed supportive care and red flags that should prompt return.  Pt expressed understanding and is in agreement w/ plan.

## 2022-04-19 ENCOUNTER — Other Ambulatory Visit (HOSPITAL_COMMUNITY): Payer: Self-pay

## 2022-04-20 ENCOUNTER — Other Ambulatory Visit: Payer: Self-pay

## 2022-05-30 ENCOUNTER — Encounter: Payer: Self-pay | Admitting: Family Medicine

## 2022-05-31 ENCOUNTER — Other Ambulatory Visit (HOSPITAL_BASED_OUTPATIENT_CLINIC_OR_DEPARTMENT_OTHER): Payer: Self-pay

## 2022-05-31 ENCOUNTER — Other Ambulatory Visit: Payer: Self-pay

## 2022-05-31 DIAGNOSIS — J4530 Mild persistent asthma, uncomplicated: Secondary | ICD-10-CM

## 2022-05-31 MED ORDER — BUDESONIDE-FORMOTEROL FUMARATE 160-4.5 MCG/ACT IN AERO
INHALATION_SPRAY | RESPIRATORY_TRACT | 11 refills | Status: DC
  Filled 2022-05-31: qty 10.2, 30d supply, fill #0
  Filled 2022-07-27: qty 10.2, 30d supply, fill #1
  Filled 2022-09-18: qty 10.2, 30d supply, fill #2
  Filled 2022-11-01: qty 10.2, 30d supply, fill #3
  Filled 2022-12-10 (×2): qty 10.2, 30d supply, fill #4
  Filled 2023-01-10: qty 10.2, 30d supply, fill #5
  Filled 2023-03-30: qty 10.2, 30d supply, fill #6

## 2022-06-10 ENCOUNTER — Encounter: Payer: Self-pay | Admitting: Family Medicine

## 2022-06-10 ENCOUNTER — Ambulatory Visit (INDEPENDENT_AMBULATORY_CARE_PROVIDER_SITE_OTHER): Payer: 59 | Admitting: Family Medicine

## 2022-06-10 VITALS — BP 116/70 | HR 67 | Temp 97.8°F | Resp 18 | Ht 67.0 in | Wt 144.1 lb

## 2022-06-10 DIAGNOSIS — R7309 Other abnormal glucose: Secondary | ICD-10-CM

## 2022-06-10 DIAGNOSIS — Z0000 Encounter for general adult medical examination without abnormal findings: Secondary | ICD-10-CM | POA: Diagnosis not present

## 2022-06-10 LAB — CBC WITH DIFFERENTIAL/PLATELET
Basophils Absolute: 0 10*3/uL (ref 0.0–0.1)
Basophils Relative: 0.8 % (ref 0.0–3.0)
Eosinophils Absolute: 0.1 10*3/uL (ref 0.0–0.7)
Eosinophils Relative: 2.4 % (ref 0.0–5.0)
HCT: 39.7 % (ref 36.0–46.0)
Hemoglobin: 13.4 g/dL (ref 12.0–15.0)
Lymphocytes Relative: 26.3 % (ref 12.0–46.0)
Lymphs Abs: 1.5 10*3/uL (ref 0.7–4.0)
MCHC: 33.8 g/dL (ref 30.0–36.0)
MCV: 90.5 fl (ref 78.0–100.0)
Monocytes Absolute: 0.5 10*3/uL (ref 0.1–1.0)
Monocytes Relative: 8.5 % (ref 3.0–12.0)
Neutro Abs: 3.6 10*3/uL (ref 1.4–7.7)
Neutrophils Relative %: 62 % (ref 43.0–77.0)
Platelets: 226 10*3/uL (ref 150.0–400.0)
RBC: 4.38 Mil/uL (ref 3.87–5.11)
RDW: 13.5 % (ref 11.5–15.5)
WBC: 5.8 10*3/uL (ref 4.0–10.5)

## 2022-06-10 LAB — HEPATIC FUNCTION PANEL
ALT: 10 U/L (ref 0–35)
AST: 11 U/L (ref 0–37)
Albumin: 4.4 g/dL (ref 3.5–5.2)
Alkaline Phosphatase: 40 U/L (ref 39–117)
Bilirubin, Direct: 0.2 mg/dL (ref 0.0–0.3)
Total Bilirubin: 1.3 mg/dL — ABNORMAL HIGH (ref 0.2–1.2)
Total Protein: 6.9 g/dL (ref 6.0–8.3)

## 2022-06-10 LAB — BASIC METABOLIC PANEL
BUN: 13 mg/dL (ref 6–23)
CO2: 27 mEq/L (ref 19–32)
Calcium: 9.3 mg/dL (ref 8.4–10.5)
Chloride: 103 mEq/L (ref 96–112)
Creatinine, Ser: 0.83 mg/dL (ref 0.40–1.20)
GFR: 88.04 mL/min (ref >60.00)
Glucose, Bld: 94 mg/dL (ref 70–99)
Potassium: 4 mEq/L (ref 3.5–5.1)
Sodium: 139 mEq/L (ref 135–145)

## 2022-06-10 LAB — LIPID PANEL
Cholesterol: 181 mg/dL (ref 0–200)
HDL: 79.7 mg/dL (ref >39.00)
LDL Cholesterol: 88 mg/dL (ref 0–99)
NonHDL: 101.04
Total CHOL/HDL Ratio: 2
Triglycerides: 64 mg/dL (ref 0.0–149.0)
VLDL: 12.8 mg/dL (ref 0.0–40.0)

## 2022-06-10 LAB — TSH: TSH: 1.45 u[IU]/mL (ref 0.35–5.50)

## 2022-06-10 NOTE — Patient Instructions (Signed)
Follow up in 1 year or as needed We'll notify you of your lab results and make any changes if needed Keep up the good work on healthy diet and regular exercise- you look great!! Call with any questions or concerns Stay Safe!  Stay Healthy! Have a great weekend!

## 2022-06-10 NOTE — Assessment & Plan Note (Signed)
Pt's PE WNL.  UTD on Tdap, flu, pap.  Check labs.  Anticipatory guidance provided.

## 2022-06-10 NOTE — Progress Notes (Signed)
   Subjective:    Patient ID: Michelle Alvarez, female    DOB: Oct 20, 1980, 42 y.o.   MRN: [id number]  HPI CPE- UTD on pap, Tdap.  Pt reports feeling good.  No concerns today  Patient Care Team    Relationship Specialty Notifications Start End  Midge Minium, MD PCP - General Family Medicine  09/09/15   Azucena Fallen, MD Consulting Physician Obstetrics and Gynecology  11/18/15     Health Maintenance  Topic Date Due   PAP SMEAR-Modifier  12/24/2022   DTaP/Tdap/Td (2 - Td or Tdap) 03/03/2025   INFLUENZA VACCINE  Completed   HIV Screening  Completed   HPV VACCINES  Aged Out   COVID-19 Vaccine  Discontinued   Hepatitis C Screening  Discontinued     Review of Systems Patient reports no vision/ hearing changes, adenopathy,fever, weight change,  persistant/recurrent hoarseness , swallowing issues, chest pain, palpitations, edema, persistant/recurrent cough, hemoptysis, dyspnea (rest/exertional/paroxysmal nocturnal), gastrointestinal bleeding (melena, rectal bleeding), abdominal pain, significant heartburn, bowel changes, GU symptoms (dysuria, hematuria, incontinence), Gyn symptoms (abnormal  bleeding, pain),  syncope, focal weakness, memory loss, numbness & tingling, skin/hair/nail changes, abnormal bruising or bleeding, anxiety, or depression.     Objective:   Physical Exam General Appearance:    Alert, cooperative, no distress, appears stated age  Head:    Normocephalic, without obvious abnormality, atraumatic  Eyes:    PERRL, conjunctiva/corneas clear, EOM's intact both eyes  Ears:    Normal TM's and external ear canals, both ears  Nose:   Nares normal, septum midline, mucosa normal, no drainage    or sinus tenderness  Throat:   Lips, mucosa, and tongue normal; teeth and gums normal  Neck:   Supple, symmetrical, trachea midline, no adenopathy;    Thyroid: no enlargement/tenderness/nodules  Back:     Symmetric, no curvature, ROM normal, no CVA tenderness  Lungs:     Clear to  auscultation bilaterally, respirations unlabored  Chest Wall:    No tenderness or deformity   Heart:    Regular rate and rhythm, S1 and S2 normal, no murmur, rub   or gallop  Breast Exam:    Deferred to GYN  Abdomen:     Soft, non-tender, bowel sounds active all four quadrants,    no masses, no organomegaly  Genitalia:    Deferred to GYN  Rectal:    Extremities:   Extremities normal, atraumatic, no cyanosis or edema  Pulses:   2+ and symmetric all extremities  Skin:   Skin color, texture, turgor normal, no rashes or lesions  Lymph nodes:   Cervical, supraclavicular, and axillary nodes normal  Neurologic:   CNII-XII intact, normal strength, sensation and reflexes    throughout          Assessment & Plan:

## 2022-06-13 ENCOUNTER — Telehealth: Payer: Self-pay

## 2022-06-13 NOTE — Telephone Encounter (Signed)
Left pt a vm stating lab results

## 2022-06-13 NOTE — Telephone Encounter (Signed)
-----  Message from Midge Minium, MD sent at 06/13/2022  1:10 PM EST ----- Labs look great!  No changes at this time

## 2022-07-27 ENCOUNTER — Other Ambulatory Visit (HOSPITAL_BASED_OUTPATIENT_CLINIC_OR_DEPARTMENT_OTHER): Payer: Self-pay

## 2022-07-28 ENCOUNTER — Other Ambulatory Visit (HOSPITAL_BASED_OUTPATIENT_CLINIC_OR_DEPARTMENT_OTHER): Payer: Self-pay

## 2022-07-28 ENCOUNTER — Other Ambulatory Visit: Payer: Self-pay

## 2022-09-19 ENCOUNTER — Other Ambulatory Visit (HOSPITAL_BASED_OUTPATIENT_CLINIC_OR_DEPARTMENT_OTHER): Payer: Self-pay

## 2022-10-26 ENCOUNTER — Other Ambulatory Visit (HOSPITAL_BASED_OUTPATIENT_CLINIC_OR_DEPARTMENT_OTHER): Payer: Self-pay

## 2022-10-26 DIAGNOSIS — Z1231 Encounter for screening mammogram for malignant neoplasm of breast: Secondary | ICD-10-CM

## 2022-10-28 ENCOUNTER — Ambulatory Visit (HOSPITAL_BASED_OUTPATIENT_CLINIC_OR_DEPARTMENT_OTHER)
Admission: RE | Admit: 2022-10-28 | Discharge: 2022-10-28 | Disposition: A | Discharge status: Home / Self Care | Payer: 59 | Source: Ambulatory Visit | Attending: Family Medicine | Admitting: Family Medicine

## 2022-10-28 DIAGNOSIS — Z1231 Encounter for screening mammogram for malignant neoplasm of breast: Secondary | ICD-10-CM

## 2022-12-10 ENCOUNTER — Other Ambulatory Visit (HOSPITAL_BASED_OUTPATIENT_CLINIC_OR_DEPARTMENT_OTHER): Payer: Self-pay

## 2023-01-10 ENCOUNTER — Other Ambulatory Visit: Payer: Self-pay

## 2023-01-10 ENCOUNTER — Other Ambulatory Visit (HOSPITAL_BASED_OUTPATIENT_CLINIC_OR_DEPARTMENT_OTHER): Payer: Self-pay

## 2023-01-10 ENCOUNTER — Other Ambulatory Visit: Payer: Self-pay | Admitting: Family Medicine

## 2023-01-10 MED ORDER — ALBUTEROL SULFATE HFA 108 (90 BASE) MCG/ACT IN AERS
2.0000 | INHALATION_SPRAY | RESPIRATORY_TRACT | 6 refills | Status: DC | PRN
  Filled 2023-01-10: qty 6.7, 17d supply, fill #0
  Filled 2023-06-04: qty 6.7, 17d supply, fill #1
  Filled 2023-11-10 – 2023-11-11 (×2): qty 6.7, 17d supply, fill #2

## 2023-02-14 ENCOUNTER — Other Ambulatory Visit (HOSPITAL_COMMUNITY): Payer: Self-pay

## 2023-02-20 ENCOUNTER — Other Ambulatory Visit (HOSPITAL_COMMUNITY): Payer: Self-pay

## 2023-02-22 ENCOUNTER — Encounter (INDEPENDENT_AMBULATORY_CARE_PROVIDER_SITE_OTHER): Payer: 59 | Admitting: Family Medicine

## 2023-02-22 DIAGNOSIS — J40 Bronchitis, not specified as acute or chronic: Secondary | ICD-10-CM

## 2023-02-23 ENCOUNTER — Telehealth: Payer: 59 | Admitting: Physician Assistant

## 2023-02-23 ENCOUNTER — Other Ambulatory Visit (HOSPITAL_BASED_OUTPATIENT_CLINIC_OR_DEPARTMENT_OTHER): Payer: Self-pay

## 2023-02-23 DIAGNOSIS — J4531 Mild persistent asthma with (acute) exacerbation: Secondary | ICD-10-CM

## 2023-02-23 MED ORDER — BENZONATATE 100 MG PO CAPS
100.0000 mg | ORAL_CAPSULE | Freq: Three times a day (TID) | ORAL | 0 refills | Status: DC | PRN
Start: 2023-02-23 — End: 2023-07-29
  Filled 2023-02-23: qty 30, 10d supply, fill #0

## 2023-02-23 MED ORDER — PREDNISONE 20 MG PO TABS
40.0000 mg | ORAL_TABLET | Freq: Every day | ORAL | 0 refills | Status: DC
Start: 2023-02-23 — End: 2023-08-04
  Filled 2023-02-23: qty 10, 5d supply, fill #0

## 2023-02-23 NOTE — Progress Notes (Signed)
I have spent 5 minutes in review of e-visit questionnaire, review and updating patient chart, medical decision making and response to patient.   Mia Milan Cody Jacklynn Dehaas, PA-C    

## 2023-02-23 NOTE — Progress Notes (Signed)
E-Visit for Cough   We are sorry that you are not feeling well.  Here is how we plan to help!  Based on your presentation I believe you most likely have an asthmatic bronchitis. You should stay hydrated and try to get plenty of rest. You can use OTC  Mucinex-DM for cough and congestion. Keep up with your asthma and allergy medications.  In addition I have prescribed A prescription cough medication called Tessalon Perles 100mg . You may take 1-2 capsules every 8 hours as needed for your cough. I have also prescribed a 5-day course of prednisone.  From your responses in the eVisit questionnaire you describe inflammation in the upper respiratory tract which is causing a significant cough.  This is commonly called Bronchitis and has four common causes:   Allergies Viral Infections Acid Reflux Bacterial Infection Allergies, viruses and acid reflux are treated by controlling symptoms or eliminating the cause. An example might be a cough caused by taking certain blood pressure medications. You stop the cough by changing the medication. Another example might be a cough caused by acid reflux. Controlling the reflux helps control the cough.  USE OF BRONCHODILATOR ("RESCUE") INHALERS: There is a risk from using your bronchodilator too frequently.  The risk is that over-reliance on a medication which only relaxes the muscles surrounding the breathing tubes can reduce the effectiveness of medications prescribed to reduce swelling and congestion of the tubes themselves.  Although you feel brief relief from the bronchodilator inhaler, your asthma may actually be worsening with the tubes becoming more swollen and filled with mucus.  This can delay other crucial treatments, such as oral steroid medications. If you need to use a bronchodilator inhaler daily, several times per day, you should discuss this with your provider.  There are probably better treatments that could be used to keep your asthma under control.      HOME CARE Only take medications as instructed by your medical team. Complete the entire course of an antibiotic. Drink plenty of fluids and get plenty of rest. Avoid close contacts especially the very young and the elderly Cover your mouth if you cough or cough into your sleeve. Always remember to wash your hands A steam or ultrasonic humidifier can help congestion.   GET HELP RIGHT AWAY IF: You develop worsening fever. You become short of breath You cough up blood. Your symptoms persist after you have completed your treatment plan MAKE SURE YOU  Understand these instructions. Will watch your condition. Will get help right away if you are not doing well or get worse.    Thank you for choosing an e-visit.  Your e-visit answers were reviewed by a board certified advanced clinical practitioner to complete your personal care plan. Depending upon the condition, your plan could have included both over the counter or prescription medications.  Please review your pharmacy choice. Make sure the pharmacy is open so you can pick up prescription now. If there is a problem, you may contact your provider through [employer] and have the prescription routed to another pharmacy.  Your safety is important to [place]. If you have drug allergies check your prescription carefully.   For the next 24 hours you can use MyChart to ask questions about today's visit, request a non-urgent call back, or ask for a work or school excuse. You will get an email in the next two days asking about your experience. I hope that your e-visit has been valuable and will speed your recovery.

## 2023-02-24 ENCOUNTER — Other Ambulatory Visit (HOSPITAL_COMMUNITY): Payer: Self-pay

## 2023-02-24 ENCOUNTER — Other Ambulatory Visit (HOSPITAL_BASED_OUTPATIENT_CLINIC_OR_DEPARTMENT_OTHER): Payer: Self-pay

## 2023-02-24 DIAGNOSIS — J40 Bronchitis, not specified as acute or chronic: Secondary | ICD-10-CM | POA: Diagnosis not present

## 2023-02-24 MED ORDER — AZITHROMYCIN 250 MG PO TABS
ORAL_TABLET | ORAL | 0 refills | Status: AC
  Filled 2023-02-24 (×2): qty 6, 5d supply, fill #0

## 2023-02-24 NOTE — Telephone Encounter (Signed)
[facility] VISIT   Patient agreed to [facility] visit and is aware that copayment and coinsurance may apply. Patient was treated using telemedicine according to accepted telemedicine protocols.  Subjective:   Patient complains of bronchitis  Patient Active Problem List   Diagnosis Date Noted   Mild persistent asthma 01/24/2015   Deviated nasal septum 05/21/2012    Class: Chronic   Toenail fungus 11/18/2011   Allergic rhinitis 06/29/2011   Physical exam 06/29/2011   Social History   Tobacco Use   Smoking status: Never   Smokeless tobacco: Never  Substance Use Topics   Alcohol use: Yes    Comment: occasionally    Current Outpatient Medications:    azithromycin (ZITHROMAX) 250 MG tablet, 2 tabs on day 1, 1 tab on day 2-5, Disp: 6 tablet, Rfl: 0   albuterol (VENTOLIN HFA) 108 (90 Base) MCG/ACT inhaler, Inhale 2 puffs into the lungs every 4 (four) hours as needed., Disp: 6.7 g, Rfl: 6   benzonatate (TESSALON) 100 MG capsule, Take 1 capsule (100 mg total) by mouth 3 (three) times daily as needed for cough., Disp: 30 capsule, Rfl: 0   Budesonide (RHINOCORT ALLERGY NA), Place 1 spray into both nostrils daily as needed., Disp: , Rfl:    budesonide-formoterol (SYMBICORT) 160-4.5 MCG/ACT inhaler, INHALE 2 PUFFS BY MOUTH INTO THE LUNGS 2 TIMES DAILY., Disp: 10.2 g, Rfl: 11   loratadine (CLARITIN) 10 MG tablet, Take 10 mg daily by mouth., Disp: , Rfl:    predniSONE (DELTASONE) 20 MG tablet, Take 2 tablets (40 mg total) by mouth daily with breakfast., Disp: 10 tablet, Rfl: 0  Allergies  Allergen Reactions   Cephalexin Rash   Doxycycline Rash   Penicillins Rash    Has patient had a PCN reaction causing immediate rash, facial/tongue/throat swelling, SOB or lightheadedness with hypotension: No Has patient had a PCN reaction causing severe rash involving mucus membranes or skin necrosis: No Has patient had a PCN reaction that required hospitalization: No Has patient had a PCN reaction occurring  within the last 10 years: No If all of the above answers are "NO", then may proceed with Cephalosporin use.    Vancomycin Itching    Assessment and Plan:   Diagnosis: bronchitis. Please see myChart communication and orders below.   No orders of the defined types were placed in this encounter.  Meds ordered this encounter  Medications   azithromycin (ZITHROMAX) 250 MG tablet    Sig: 2 tabs on day 1, 1 tab on day 2-5    Dispense:  6 tablet    Refill:  0    Neena Rhymes, MD 02/24/2023  A total of 7 minutes were spent by me to personally review the patient-generated inquiry, review patient records and data pertinent to assessment of the patient's problem, develop a management plan including generation of prescriptions and/or orders, and on subsequent communication with the patient through secure the MyChart portal service.   There is no separately reported E/M service related to this service in the past 7 days nor does the patient have an upcoming soonest available appointment for this issue. This work was completed in less than 7 days.   The patient consented to this service today (see patient agreement prior to ongoing communication). Patient counseled regarding the need for in-person exam for certain conditions and was advised to call the office if any changing or worsening symptoms occur.   The codes to be used for the E/M service are: [x]   99421 for 5-10 minutes  of time spent on the inquiry. []   [id number] for 11-20 minutes. []   [id number] for 21+ minutes.

## 2023-02-27 ENCOUNTER — Other Ambulatory Visit (HOSPITAL_COMMUNITY): Payer: Self-pay

## 2023-04-06 ENCOUNTER — Other Ambulatory Visit: Payer: Self-pay | Admitting: Sports Medicine

## 2023-04-06 DIAGNOSIS — S83207D Unspecified tear of unspecified meniscus, current injury, left knee, subsequent encounter: Secondary | ICD-10-CM

## 2023-04-06 DIAGNOSIS — G8929 Other chronic pain: Secondary | ICD-10-CM

## 2023-04-11 ENCOUNTER — Other Ambulatory Visit (HOSPITAL_COMMUNITY): Payer: Self-pay

## 2023-04-30 ENCOUNTER — Ambulatory Visit
Admission: RE | Admit: 2023-04-30 | Discharge: 2023-04-30 | Disposition: A | Discharge status: Home / Self Care | Payer: 59 | Source: Ambulatory Visit | Attending: Sports Medicine | Admitting: Sports Medicine

## 2023-04-30 DIAGNOSIS — M25562 Pain in left knee: Secondary | ICD-10-CM | POA: Diagnosis not present

## 2023-04-30 DIAGNOSIS — G8929 Other chronic pain: Secondary | ICD-10-CM | POA: Diagnosis not present

## 2023-04-30 DIAGNOSIS — S83207D Unspecified tear of unspecified meniscus, current injury, left knee, subsequent encounter: Secondary | ICD-10-CM

## 2023-05-23 DIAGNOSIS — L814 Other melanin hyperpigmentation: Secondary | ICD-10-CM | POA: Diagnosis not present

## 2023-05-23 DIAGNOSIS — L821 Other seborrheic keratosis: Secondary | ICD-10-CM | POA: Diagnosis not present

## 2023-05-23 DIAGNOSIS — D225 Melanocytic nevi of trunk: Secondary | ICD-10-CM | POA: Diagnosis not present

## 2023-06-04 ENCOUNTER — Other Ambulatory Visit: Payer: Self-pay | Admitting: Family Medicine

## 2023-06-04 DIAGNOSIS — J4530 Mild persistent asthma, uncomplicated: Secondary | ICD-10-CM

## 2023-06-05 ENCOUNTER — Other Ambulatory Visit: Payer: Self-pay

## 2023-06-05 ENCOUNTER — Other Ambulatory Visit (HOSPITAL_BASED_OUTPATIENT_CLINIC_OR_DEPARTMENT_OTHER): Payer: Self-pay

## 2023-06-05 MED ORDER — BUDESONIDE-FORMOTEROL FUMARATE 160-4.5 MCG/ACT IN AERO
INHALATION_SPRAY | RESPIRATORY_TRACT | 11 refills | Status: DC
  Filled 2023-06-05: qty 10.2, 28d supply, fill #0
  Filled 2023-07-22: qty 10.2, 28d supply, fill #1
  Filled 2023-09-07: qty 10.2, 28d supply, fill #2
  Filled 2023-11-10 – 2023-11-11 (×2): qty 10.2, 28d supply, fill #3
  Filled 2023-12-05: qty 10.2, 28d supply, fill #4
  Filled 2024-01-02: qty 10.2, 28d supply, fill #5
  Filled 2024-02-06: qty 10.2, 28d supply, fill #6
  Filled 2024-04-23: qty 10.2, 28d supply, fill #7

## 2023-06-16 ENCOUNTER — Encounter: Payer: 59 | Admitting: Family Medicine

## 2023-07-29 ENCOUNTER — Telehealth: Admitting: Nurse Practitioner

## 2023-07-29 ENCOUNTER — Other Ambulatory Visit (HOSPITAL_BASED_OUTPATIENT_CLINIC_OR_DEPARTMENT_OTHER): Payer: Self-pay

## 2023-07-29 DIAGNOSIS — J40 Bronchitis, not specified as acute or chronic: Secondary | ICD-10-CM

## 2023-07-29 DIAGNOSIS — J4531 Mild persistent asthma with (acute) exacerbation: Secondary | ICD-10-CM | POA: Diagnosis not present

## 2023-07-29 MED ORDER — AZITHROMYCIN 250 MG PO TABS
ORAL_TABLET | ORAL | 0 refills | Status: AC
Start: 2023-07-29 — End: 2023-08-03
  Filled 2023-07-29: qty 6, 5d supply, fill #0

## 2023-07-29 MED ORDER — PSEUDOEPH-BROMPHEN-DM 30-2-10 MG/5ML PO SYRP
5.0000 mL | ORAL_SOLUTION | Freq: Four times a day (QID) | ORAL | 0 refills | Status: DC | PRN
Start: 2023-07-29 — End: 2023-07-29
  Filled 2023-07-29: qty 240, 12d supply, fill #0

## 2023-07-29 MED ORDER — BENZONATATE 100 MG PO CAPS
100.0000 mg | ORAL_CAPSULE | Freq: Three times a day (TID) | ORAL | 0 refills | Status: DC | PRN
Start: 2023-07-29 — End: 2023-08-04
  Filled 2023-07-29: qty 30, 10d supply, fill #0

## 2023-07-29 NOTE — Progress Notes (Signed)
 E-Visit for Cough   We are sorry that you are not feeling well.  Here is how we plan to help!  Based on your presentation I believe you most likely have A cough due to bacteria.  When patients have a fever and a productive cough with a change in color or increased sputum production, we are concerned about bacterial bronchitis.  If left untreated it can progress to pneumonia.  If your symptoms do not improve with your treatment plan it is important that you contact your provider.   I have prescribed Azithromyin 250 mg: two tablets now and then one tablet daily for 4 additonal days    In addition you may use bromfed cough syrup which has been sent.  From your responses in the eVisit questionnaire you describe inflammation in the upper respiratory tract which is causing a significant cough.  This is commonly called Bronchitis and has four common causes:   Allergies Viral Infections Acid Reflux Bacterial Infection Allergies, viruses and acid reflux are treated by controlling symptoms or eliminating the cause. An example might be a cough caused by taking certain blood pressure medications. You stop the cough by changing the medication. Another example might be a cough caused by acid reflux. Controlling the reflux helps control the cough.  USE OF BRONCHODILATOR ("RESCUE") INHALERS: There is a risk from using your bronchodilator too frequently.  The risk is that over-reliance on a medication which only relaxes the muscles surrounding the breathing tubes can reduce the effectiveness of medications prescribed to reduce swelling and congestion of the tubes themselves.  Although you feel brief relief from the bronchodilator inhaler, your asthma may actually be worsening with the tubes becoming more swollen and filled with mucus.  This can delay other crucial treatments, such as oral steroid medications. If you need to use a bronchodilator inhaler daily, several times per day, you should discuss this with your  provider.  There are probably better treatments that could be used to keep your asthma under control.     HOME CARE Only take medications as instructed by your medical team. Complete the entire course of an antibiotic. Drink plenty of fluids and get plenty of rest. Avoid close contacts especially the very young and the elderly Cover your mouth if you cough or cough into your sleeve. Always remember to wash your hands A steam or ultrasonic humidifier can help congestion.   GET HELP RIGHT AWAY IF: You develop worsening fever. You become short of breath You cough up blood. Your symptoms persist after you have completed your treatment plan MAKE SURE YOU  Understand these instructions. Will watch your condition. Will get help right away if you are not doing well or get worse.    Thank you for choosing an e-visit.  Your e-visit answers were reviewed by a board certified advanced clinical practitioner to complete your personal care plan. Depending upon the condition, your plan could have included both over the counter or prescription medications.  Please review your pharmacy choice. Make sure the pharmacy is open so you can pick up prescription now. If there is a problem, you may contact your provider through [employer] and have the prescription routed to another pharmacy.  Your safety is important to [place]. If you have drug allergies check your prescription carefully.   For the next 24 hours you can use MyChart to ask questions about today's visit, request a non-urgent call back, or ask for a work or school excuse. You will get an  email in the next two days asking about your experience. I hope that your e-visit has been valuable and will speed your recovery.

## 2023-07-29 NOTE — Progress Notes (Signed)
 I have spent 5 minutes in review of e-visit questionnaire, review and updating patient chart, medical decision making and response to patient.   Claiborne Rigg, NP

## 2023-08-01 ENCOUNTER — Other Ambulatory Visit: Payer: Self-pay

## 2023-08-04 ENCOUNTER — Encounter: Payer: Self-pay | Admitting: Family Medicine

## 2023-08-04 ENCOUNTER — Ambulatory Visit (INDEPENDENT_AMBULATORY_CARE_PROVIDER_SITE_OTHER): Payer: 59 | Admitting: Family Medicine

## 2023-08-04 VITALS — BP 112/60 | HR 72 | Temp 98.6°F | Ht 66.34 in | Wt 145.0 lb

## 2023-08-04 DIAGNOSIS — Z0000 Encounter for general adult medical examination without abnormal findings: Secondary | ICD-10-CM

## 2023-08-04 NOTE — Progress Notes (Signed)
   Subjective:    Patient ID: Michelle Alvarez, female    DOB: 08/30/1980, 43 y.o.   MRN: [id number]  HPI CPE- UTD on Tdap, mammo.  Has GYN- will schedule pap  Patient Care Team    Relationship Specialty Notifications Start End  Sheliah Hatch, MD PCP - General Family Medicine  09/09/15   Shea Evans, MD Consulting Physician Obstetrics and Gynecology  11/18/15      Health Maintenance  Topic Date Due   Pneumococcal Vaccine 48-12 Years old (1 of 2 - PCV) Never done   INFLUENZA VACCINE  12/15/2022   Cervical Cancer Screening (HPV/Pap Cotest)  12/24/2022   DTaP/Tdap/Td (2 - Td or Tdap) 03/03/2025   HIV Screening  Completed   HPV VACCINES  Aged Out   COVID-19 Vaccine  Discontinued   Hepatitis C Screening  Discontinued      Review of Systems Patient reports no vision/ hearing changes, adenopathy,fever, weight change,  persistant/recurrent hoarseness , swallowing issues, chest pain, palpitations, edema, persistant/recurrent cough, hemoptysis, dyspnea (rest/exertional/paroxysmal nocturnal), gastrointestinal bleeding (melena, rectal bleeding), abdominal pain, significant heartburn, bowel changes, GU symptoms (dysuria, hematuria, incontinence), Gyn symptoms (abnormal  bleeding, pain),  syncope, focal weakness, memory loss, numbness & tingling, skin/hair/nail changes, abnormal bruising or bleeding, anxiety, or depression.     Objective:   Physical Exam General Appearance:    Alert, cooperative, no distress, appears stated age  Head:    Normocephalic, without obvious abnormality, atraumatic  Eyes:    PERRL, conjunctiva/corneas clear, EOM's intact both eyes  Ears:    Normal TM's and external ear canals, both ears  Nose:   Nares normal, septum midline, mucosa normal, no drainage    or sinus tenderness  Throat:   Lips, mucosa, and tongue normal; teeth and gums normal  Neck:   Supple, symmetrical, trachea midline, no adenopathy;    Thyroid: no enlargement/tenderness/nodules  Back:      Symmetric, no curvature, ROM normal, no CVA tenderness  Lungs:     Clear to auscultation bilaterally, respirations unlabored  Chest Wall:    No tenderness or deformity   Heart:    Regular rate and rhythm, S1 and S2 normal, no murmur, rub   or gallop  Breast Exam:    Deferred to GYN  Abdomen:     Soft, non-tender, bowel sounds active all four quadrants,    no masses, no organomegaly  Genitalia:    Deferred to GYN  Rectal:    Extremities:   Extremities normal, atraumatic, no cyanosis or edema  Pulses:   2+ and symmetric all extremities  Skin:   Skin color, texture, turgor normal, no rashes or lesions  Lymph nodes:   Cervical, supraclavicular, and axillary nodes normal  Neurologic:   CNII-XII intact, normal strength, sensation and reflexes    throughout          Assessment & Plan:

## 2023-08-04 NOTE — Assessment & Plan Note (Signed)
 Pt's PE WNL.  UTD on mammo, Tdap.  Due for pap- pt to schedule.  No need for labs due to lack of risk factors.  Anticipatory guidance provided.

## 2023-08-04 NOTE — Patient Instructions (Signed)
 Follow up in 1 year or as a needed No need for labs today- yay! Keep up the good work on healthy diet and regular exercise- you look great!! Call with any questions or concerns Stay Safe!  Stay Healthy! Happy Spring!!!

## 2023-08-29 ENCOUNTER — Encounter: Payer: Self-pay | Admitting: Family Medicine

## 2023-08-31 ENCOUNTER — Other Ambulatory Visit (HOSPITAL_BASED_OUTPATIENT_CLINIC_OR_DEPARTMENT_OTHER): Payer: Self-pay

## 2023-08-31 ENCOUNTER — Telehealth: Admitting: Physician Assistant

## 2023-08-31 DIAGNOSIS — J0100 Acute maxillary sinusitis, unspecified: Secondary | ICD-10-CM | POA: Diagnosis not present

## 2023-08-31 MED ORDER — METHYLPREDNISOLONE 4 MG PO TBPK
ORAL_TABLET | ORAL | 0 refills | Status: DC
  Filled 2023-08-31: qty 21, 6d supply, fill #0

## 2023-08-31 NOTE — Progress Notes (Signed)
 Virtual Visit Consent   Michelle Alvarez, you are scheduled for a virtual visit with a  provider today. Just as with appointments in the office, your consent must be obtained to participate. Your consent will be active for this visit and any virtual visit you may have with one of our providers in the next 365 days. If you have a MyChart account, a copy of this consent can be sent to you electronically.  As this is a virtual visit, video technology does not allow for your provider to perform a traditional examination. This may limit your provider's ability to fully assess your condition. If your provider identifies any concerns that need to be evaluated in person or the need to arrange testing (such as labs, EKG, etc.), we will make arrangements to do so. Although advances in technology are sophisticated, we cannot ensure that it will always work on either your end or our end. If the connection with a video visit is poor, the visit may have to be switched to a telephone visit. With either a video or telephone visit, we are not always able to ensure that we have a secure connection.  By engaging in this virtual visit, you consent to the provision of healthcare and authorize for your insurance to be billed (if applicable) for the services provided during this visit. Depending on your insurance coverage, you may receive a charge related to this service.  I need to obtain your verbal consent now. Are you willing to proceed with your visit today? [name] has provided verbal consent on 08/31/2023 for a virtual visit (video or telephone). Michelle Gutta, PA-C  Date: 08/31/2023 5:25 PM   Virtual Visit via Video Note   I, Michelle Alvarez, connected with  Michelle Alvarez  ([id number], May 20, 1980) on 08/31/23 at  5:30 PM EDT by a video-enabled telemedicine application and verified that I am speaking with the correct person using two identifiers.  Location: Patient: Virtual Visit  Location Patient: Home Provider: Virtual Visit Location Provider: Home Office   I discussed the limitations of evaluation and management by telemedicine and the availability of in person appointments. The patient expressed understanding and agreed to proceed.    History of Present Illness: Michelle Alvarez is a 43 y.o. who identifies as a female who was assigned female at birth, and is being seen today for sinus pressure and pain.  HPI: 43 y/o F presents via video visit for c/o maxillary sinus pressure and pain x 4-5 days and getting worse. She had a telehealth visit earlier and was started on Azithromycin, but hasn't improved her symptoms. She states only medicine that is helping is Aleve which relives her pressure. She is also using nasal spray. Usually take Claritin, but hasn't recently. No fever.   Sinusitis    Problems:  Patient Active Problem List   Diagnosis Date Noted   Mild persistent asthma 01/24/2015   Deviated nasal septum 05/21/2012    Class: Chronic   Toenail fungus 11/18/2011   Allergic rhinitis 06/29/2011   Physical exam 06/29/2011    Allergies:  Allergies  Allergen Reactions   Cephalexin Rash   Doxycycline Rash   Penicillins Rash    Has patient had a PCN reaction causing immediate rash, facial/tongue/throat swelling, SOB or lightheadedness with hypotension: No Has patient had a PCN reaction causing severe rash involving mucus membranes or skin necrosis: No Has patient had a PCN reaction that required hospitalization: No Has patient had a PCN reaction occurring within  the last 10 years: No If all of the above answers are "NO", then may proceed with Cephalosporin use.    Vancomycin Itching   Medications:  Current Outpatient Medications:    methylPREDNISolone (MEDROL DOSEPAK) 4 MG TBPK tablet, Take as directed, Disp: 1 each, Rfl: 0   albuterol (VENTOLIN HFA) 108 (90 Base) MCG/ACT inhaler, Inhale 2 puffs into the lungs every 4 (four) hours as needed., Disp:  6.7 g, Rfl: 6   Budesonide (RHINOCORT ALLERGY NA), Place 1 spray into both nostrils daily as needed., Disp: , Rfl:    budesonide-formoterol (SYMBICORT) 160-4.5 MCG/ACT inhaler, INHALE 2 PUFFS BY MOUTH INTO THE LUNGS 2 TIMES DAILY., Disp: 10.2 g, Rfl: 11   loratadine (CLARITIN) 10 MG tablet, Take 10 mg daily by mouth., Disp: , Rfl:   Observations/Objective: Patient is well-developed, well-nourished in no acute distress.  Resting comfortably  at home.  Head is normocephalic, atraumatic.  No labored breathing.  Speech is clear and coherent with logical content.  Patient is alert and oriented at baseline.    Assessment and Plan: 1. Acute non-recurrent maxillary sinusitis (Primary) - methylPREDNISolone (MEDROL DOSEPAK) 4 MG TBPK tablet; Take as directed  Dispense: 1 each; Refill: 0  Increase fluids Start Medrol dosepack Re-start Claritin Continue with nasal spray Continue to watch for worsening symptoms. Schedule a virtual appointment or follow up at an urgent care clinic if symptoms don't improve.  Pt verbalized understanding and in agreement.    Follow Up Instructions: I discussed the assessment and treatment plan with the patient. The patient was provided an opportunity to ask questions and all were answered. The patient agreed with the plan and demonstrated an understanding of the instructions.  A copy of instructions were sent to the patient via MyChart unless otherwise noted below.   Patient has requested to receive PHI (AVS, Work Notes, etc) pertaining to this video visit through e-mail as they are currently without active MyChart. They have voiced understand that email is not considered secure and their health information could be viewed by someone other than the patient.   The patient was advised to call back or seek an in-person evaluation if the symptoms worsen or if the condition fails to improve as anticipated.    [name], PA-C

## 2023-08-31 NOTE — Patient Instructions (Signed)
  Michelle Alvarez, thank you for joining [name], PA-C for today's virtual visit.  While this provider is not your primary care provider (PCP), if your PCP is located in our provider database this encounter information will be shared with them immediately following your visit.   A [facility] MyChart account gives you access to today's visit and all your visits, tests, and labs performed at [facility] " click here if you don't have a [facility] MyChart account or go to [email or web address]  Consent: (Patient) Michelle Alvarez provided verbal consent for this virtual visit at the beginning of the encounter.  Current Medications:  Current Outpatient Medications:    methylPREDNISolone (MEDROL DOSEPAK) 4 MG TBPK tablet, Take as directed, Disp: 1 each, Rfl: 0   albuterol (VENTOLIN HFA) 108 (90 Base) MCG/ACT inhaler, Inhale 2 puffs into the lungs every 4 (four) hours as needed., Disp: 6.7 g, Rfl: 6   Budesonide (RHINOCORT ALLERGY NA), Place 1 spray into both nostrils daily as needed., Disp: , Rfl:    budesonide-formoterol (SYMBICORT) 160-4.5 MCG/ACT inhaler, INHALE 2 PUFFS BY MOUTH INTO THE LUNGS 2 TIMES DAILY., Disp: 10.2 g, Rfl: 11   loratadine (CLARITIN) 10 MG tablet, Take 10 mg daily by mouth., Disp: , Rfl:    Medications ordered in this encounter:  Meds ordered this encounter  Medications   methylPREDNISolone (MEDROL DOSEPAK) 4 MG TBPK tablet    Sig: Take as directed    Dispense:  1 each    Refill:  0    Supervising Provider:   Corine Dice [[id number]]     *If you need refills on other medications prior to your next appointment, please contact your pharmacy*  Follow-Up: Call back or seek an in-person evaluation if the symptoms worsen or if the condition fails to improve as anticipated.  [facility] [phone]  Other Instructions Increase fluids Start Medrol dosepack Re-start Claritin Continue with nasal spray Continue to watch  for worsening symptoms. Schedule a virtual appointment or follow up at an urgent care clinic if symptoms don't improve.    If you have been instructed to have an in-person evaluation today at a local Urgent Care facility, please use the link below. It will take you to a list of all of our available [facility], including address, phone number and hours of operation. Please do not delay care.  [facility]  If you or a family member do not have a primary care provider, use the link below to schedule a visit and establish care. When you choose a [facility] primary care physician or advanced practice provider, you gain a long-term partner in health. Find a Primary Care Provider  Learn more about [facility]'s in-office and virtual care options: [facility] - Get Care Now

## 2023-10-21 ENCOUNTER — Telehealth: Admitting: Physician Assistant

## 2023-10-21 ENCOUNTER — Other Ambulatory Visit (HOSPITAL_BASED_OUTPATIENT_CLINIC_OR_DEPARTMENT_OTHER): Payer: Self-pay

## 2023-10-21 DIAGNOSIS — J020 Streptococcal pharyngitis: Secondary | ICD-10-CM

## 2023-10-21 MED ORDER — AZITHROMYCIN 250 MG PO TABS
ORAL_TABLET | ORAL | 0 refills | Status: AC
Start: 2023-10-21 — End: 2023-10-26
  Filled 2023-10-21: qty 6, 5d supply, fill #0

## 2023-10-21 NOTE — Progress Notes (Signed)
 E-Visit for Sore Throat - Strep Symptoms  We are sorry that you are not feeling well.  Here is how we plan to help!  Based on what you have shared with me it is likely that you have strep pharyngitis.  Strep pharyngitis is inflammation and infection in the back of the throat.  This is an infection cause by bacteria and is treated with antibiotics.  I have prescribed Azithromycin 250 mg two tablets today and then one daily for 4 additional days. For throat pain, we recommend over the counter oral pain relief medications such as acetaminophen or aspirin, or anti-inflammatory medications such as ibuprofen or naproxen sodium. Topical treatments such as oral throat lozenges or sprays may be used as needed. Strep infections are not as easily transmitted as other respiratory infections, however we still recommend that you avoid close contact with loved ones, especially the very young and elderly.  Remember to wash your hands thoroughly throughout the day as this is the number one way to prevent the spread of infection and wipe down door knobs and counters with disinfectant.   Home Care: Only take medications as instructed by your medical team. Complete the entire course of an antibiotic. Do not take these medications with alcohol. A steam or ultrasonic humidifier can help congestion.  You can place a towel over your head and breathe in the steam from hot water coming from a faucet. Avoid close contacts especially the very young and the elderly. Cover your mouth when you cough or sneeze. Always remember to wash your hands.  Get Help Right Away If: You develop worsening fever or sinus pain. You develop a severe head ache or visual changes. Your symptoms persist after you have completed your treatment plan.  Make sure you Understand these instructions. Will watch your condition. Will get help right away if you are not doing well or get worse.   Thank you for choosing an e-visit.  Your e-visit  answers were reviewed by a board certified advanced clinical practitioner to complete your personal care plan. Depending upon the condition, your plan could have included both over the counter or prescription medications.  Please review your pharmacy choice. Make sure the pharmacy is open so you can pick up prescription now. If there is a problem, you may contact your provider through [employer] and have the prescription routed to another pharmacy.  Your safety is important to [place]. If you have drug allergies check your prescription carefully.   For the next 24 hours you can use MyChart to ask questions about today's visit, request a non-urgent call back, or ask for a work or school excuse. You will get an email in the next two days asking about your experience. I hope that your e-visit has been valuable and will speed your recovery.

## 2023-10-21 NOTE — Progress Notes (Signed)
 I have spent 5 minutes in review of e-visit questionnaire, review and updating patient chart, medical decision making and response to patient.   Piedad Climes, PA-C

## 2023-11-03 ENCOUNTER — Encounter (HOSPITAL_BASED_OUTPATIENT_CLINIC_OR_DEPARTMENT_OTHER): Admitting: Radiology

## 2023-11-03 DIAGNOSIS — Z124 Encounter for screening for malignant neoplasm of cervix: Secondary | ICD-10-CM | POA: Diagnosis not present

## 2023-11-03 DIAGNOSIS — Z1231 Encounter for screening mammogram for malignant neoplasm of breast: Secondary | ICD-10-CM

## 2023-11-03 DIAGNOSIS — Z1331 Encounter for screening for depression: Secondary | ICD-10-CM | POA: Diagnosis not present

## 2023-11-03 DIAGNOSIS — Z01419 Encounter for gynecological examination (general) (routine) without abnormal findings: Secondary | ICD-10-CM | POA: Diagnosis not present

## 2023-11-03 DIAGNOSIS — Z113 Encounter for screening for infections with a predominantly sexual mode of transmission: Secondary | ICD-10-CM | POA: Diagnosis not present

## 2023-11-03 DIAGNOSIS — Z01411 Encounter for gynecological examination (general) (routine) with abnormal findings: Secondary | ICD-10-CM | POA: Diagnosis not present

## 2023-11-03 DIAGNOSIS — Z0142 Encounter for cervical smear to confirm findings of recent normal smear following initial abnormal smear: Secondary | ICD-10-CM | POA: Diagnosis not present

## 2023-11-11 ENCOUNTER — Other Ambulatory Visit (HOSPITAL_BASED_OUTPATIENT_CLINIC_OR_DEPARTMENT_OTHER): Payer: Self-pay

## 2023-12-01 ENCOUNTER — Encounter (HOSPITAL_BASED_OUTPATIENT_CLINIC_OR_DEPARTMENT_OTHER): Admitting: Radiology

## 2023-12-01 DIAGNOSIS — Z1231 Encounter for screening mammogram for malignant neoplasm of breast: Secondary | ICD-10-CM

## 2023-12-12 ENCOUNTER — Other Ambulatory Visit (HOSPITAL_BASED_OUTPATIENT_CLINIC_OR_DEPARTMENT_OTHER): Payer: Self-pay | Admitting: Obstetrics

## 2023-12-12 DIAGNOSIS — Z1231 Encounter for screening mammogram for malignant neoplasm of breast: Secondary | ICD-10-CM

## 2023-12-15 ENCOUNTER — Inpatient Hospital Stay (HOSPITAL_BASED_OUTPATIENT_CLINIC_OR_DEPARTMENT_OTHER): Admission: RE | Admit: 2023-12-15 | Source: Ambulatory Visit | Admitting: Radiology

## 2023-12-15 DIAGNOSIS — Z1231 Encounter for screening mammogram for malignant neoplasm of breast: Secondary | ICD-10-CM

## 2023-12-26 ENCOUNTER — Other Ambulatory Visit (HOSPITAL_BASED_OUTPATIENT_CLINIC_OR_DEPARTMENT_OTHER): Payer: Self-pay | Admitting: Obstetrics

## 2023-12-26 DIAGNOSIS — Z1231 Encounter for screening mammogram for malignant neoplasm of breast: Secondary | ICD-10-CM

## 2023-12-29 ENCOUNTER — Encounter (HOSPITAL_BASED_OUTPATIENT_CLINIC_OR_DEPARTMENT_OTHER): Payer: Self-pay | Admitting: Radiology

## 2023-12-29 ENCOUNTER — Ambulatory Visit (HOSPITAL_BASED_OUTPATIENT_CLINIC_OR_DEPARTMENT_OTHER)
Admission: RE | Admit: 2023-12-29 | Discharge: 2023-12-29 | Disposition: A | Discharge status: Home / Self Care | Source: Ambulatory Visit

## 2023-12-29 DIAGNOSIS — Z1231 Encounter for screening mammogram for malignant neoplasm of breast: Secondary | ICD-10-CM | POA: Insufficient documentation

## 2024-03-16 ENCOUNTER — Telehealth

## 2024-03-16 DIAGNOSIS — K2970 Gastritis, unspecified, without bleeding: Secondary | ICD-10-CM

## 2024-03-17 ENCOUNTER — Other Ambulatory Visit (HOSPITAL_BASED_OUTPATIENT_CLINIC_OR_DEPARTMENT_OTHER): Payer: Self-pay

## 2024-03-17 MED ORDER — ONDANSETRON HCL 4 MG PO TABS
4.0000 mg | ORAL_TABLET | Freq: Three times a day (TID) | ORAL | 0 refills | Status: DC | PRN
  Filled 2024-03-17: qty 20, 7d supply, fill #0

## 2024-03-17 NOTE — Progress Notes (Signed)
 We are sorry that you are not feeling well. Here is how we plan to help!  Based on what you have shared with me it looks like you have a Virus that is irritating your GI tract.  Vomiting is the forceful emptying of a portion of the stomach's content through the mouth.  Although nausea and vomiting can make you feel miserable, it's important to remember that these are not diseases, but rather symptoms of an underlying illness.  When we treat short term symptoms, we always caution that any symptoms that persist should be fully evaluated in a medical office.  I have prescribed a medication that will help alleviate your symptoms and allow you to stay hydrated:  Zofran  4 mg 1 tablet every 8 hours as needed for nausea and vomiting  HOME CARE: Drink clear liquids.  This is very important! Dehydration (the lack of fluid) can lead to a serious complication.  Start off with 1 tablespoon every 5 minutes for 8 hours. You may begin eating bland foods after 8 hours without vomiting.  Start with saltine crackers, white bread, rice, mashed potatoes, applesauce. After 48 hours on a bland diet, you may resume a normal diet. Try to go to sleep.  Sleep often empties the stomach and relieves the need to vomit.  GET HELP RIGHT AWAY IF:  Your symptoms do not improve or worsen within 2 days after treatment. You have a fever for over 3 days. You cannot keep down fluids after trying the medication.  MAKE SURE YOU:  Understand these instructions. Will watch your condition. Will get help right away if you are not doing well or get worse.   Thank you for choosing an e-visit. Your e-visit answers were reviewed by a board certified advanced clinical practitioner to complete your personal care plan. Depending upon the condition, your plan could have included both over the counter or prescription medications. Please review your pharmacy choice. Be sure that the pharmacy you have chosen is open so that you can pick up  your prescription now.  If there is a problem you may message your provider in MyChart to have the prescription routed to another pharmacy. Your safety is important to us . If you have drug allergies check your prescription carefully.  For the next 24 hours, you can use MyChart to ask questions about today's visit, request a non-urgent call back, or ask for a work or school excuse from your e-visit provider. You will get an e-mail in the next two days asking about your experience. I hope that your e-visit has been valuable and will speed your recovery.  I have spent 5 minutes in review of e-visit questionnaire, review and updating patient chart, medical decision making and response to patient.   Bari Learn, FNP

## 2024-03-18 ENCOUNTER — Other Ambulatory Visit (HOSPITAL_BASED_OUTPATIENT_CLINIC_OR_DEPARTMENT_OTHER): Payer: Self-pay

## 2024-03-18 ENCOUNTER — Encounter: Payer: Self-pay | Admitting: Radiology

## 2024-04-23 ENCOUNTER — Telehealth: Admitting: Family Medicine

## 2024-04-23 ENCOUNTER — Other Ambulatory Visit (HOSPITAL_BASED_OUTPATIENT_CLINIC_OR_DEPARTMENT_OTHER): Payer: Self-pay

## 2024-04-23 DIAGNOSIS — J4541 Moderate persistent asthma with (acute) exacerbation: Secondary | ICD-10-CM | POA: Diagnosis not present

## 2024-04-23 MED ORDER — PREDNISONE 20 MG PO TABS
40.0000 mg | ORAL_TABLET | Freq: Every day | ORAL | 0 refills | Status: AC
  Filled 2024-04-23: qty 10, 5d supply, fill #0

## 2024-04-23 NOTE — Progress Notes (Signed)
 E Visit for Asthma  Based on what you have shared with me, it looks like you may have a flare up of your asthma.  Asthma is a chronic (ongoing) lung disease which results in airway obstruction, inflammation and hyper-responsiveness.   Asthma symptoms vary from person to person, with common symptoms including nighttime awakening and decreased ability to participate in normal activities due to shortness of breath. It is often triggered by changes in weather, changes in the season, changes in air temperature, or inside (home, school, daycare or work) allergens such as animal dander, mold, mildew, woodstoves or cockroaches.   It can also be triggered by hormonal changes, extreme emotion, physical exertion or an upper respiratory tract illness.     It is important to identify the trigger and eliminate or avoid the trigger if possible.   If you have been prescribed medications to be taken on a regular basis, it is important to follow the asthma action plan and to follow guidelines to adjust medication in response to increasing symptoms of decreased peak expiratory flow rate.  Treatment: I have prescribed: Prednisone  40mg  by mouth per day for 5 - 7 days  Please continue to use your rescue inhaler every 4-6 hours while trying to get the flare better controlled.   HOME CARE Only take medications as instructed by your medical team. Consider wearing a mask or scarf to improve breathing when there is poor air quality as this has been shown to decrease irritation and decrease exacerbations Get rest. Using a humidifier may help nasal congestion and ease sore throat pain. Using a saline nasal spray works much the same way.  Cough drops, hard candies and sore throat lozenges may ease your cough.  Avoid close contacts, especially the very you and the elderly. Cover your mouth if you cough or  sneeze. Always remember to wash your hands.   GET HELP RIGHT AWAY IF: You develop worsening shortness of breath/difficulty breathing or chest tightness, breathlessness at rest, drowsy, confused or agitated, unable to speak in full sentences, or if you develop chest pain.  You have coughing fits. You develop a severe headache or visual changes. You develop shortness of breath, difficulty breathing or start having chest pain. Your symptoms persist after you have completed your treatment plan. If your symptoms do not improve within 5 days.  MAKE SURE YOU Understand these instructions. Will watch your condition. Will get help right away if you are not doing well or get worse.   Your e-visit answers were reviewed by a board certified advanced clinical practitioner to complete your personal care plan, Depending upon the condition, your plan could have included both over the counter or prescription medications.  Please review your pharmacy choice. Your safety is important to us . If you have drug allergies check your prescription carefully. You can use MyChart to ask questions about today's visit, request a non-urgent call back, or ask for a work or school excuse for 24 hours related to this e-Visit. If it has been greater than 24 hours you will need to follow up with your provider, or enter a new e-Visit to address those concerns.  You will get an e-mail in the next two days asking about your experience. I hope that your e-visit has been valuable and will speed your recovery. Thank you for using e-visits.  I have spent 5 minutes in review of e-visit questionnaire, review and updating patient chart, medical decision making and response to patient.   Chiquita CHRISTELLA Barefoot, NP

## 2024-05-19 ENCOUNTER — Encounter: Payer: Self-pay | Admitting: Family Medicine

## 2024-05-19 DIAGNOSIS — J4530 Mild persistent asthma, uncomplicated: Secondary | ICD-10-CM

## 2024-05-20 NOTE — Telephone Encounter (Signed)
 Patient is wanting Symbicort  and albuterol  ordered in a 90 day supply to her mail order pharmacy. Okay to send?

## 2024-05-21 ENCOUNTER — Other Ambulatory Visit (HOSPITAL_COMMUNITY): Payer: Self-pay

## 2024-05-21 MED ORDER — BUDESONIDE-FORMOTEROL FUMARATE 160-4.5 MCG/ACT IN AERO
INHALATION_SPRAY | RESPIRATORY_TRACT | 3 refills | Status: AC
  Filled 2024-05-21 – 2024-05-31 (×2): qty 30.6, 90d supply, fill #0

## 2024-05-21 MED ORDER — ALBUTEROL SULFATE HFA 108 (90 BASE) MCG/ACT IN AERS
2.0000 | INHALATION_SPRAY | RESPIRATORY_TRACT | 3 refills | Status: AC | PRN
  Filled 2024-05-21: qty 20.1, 50d supply, fill #0
  Filled 2024-05-31: qty 20.1, 51d supply, fill #0

## 2024-05-21 NOTE — Telephone Encounter (Signed)
 Patient's response back

## 2024-05-22 ENCOUNTER — Other Ambulatory Visit: Payer: Self-pay

## 2024-05-27 ENCOUNTER — Other Ambulatory Visit: Payer: Self-pay

## 2024-05-30 ENCOUNTER — Other Ambulatory Visit (HOSPITAL_COMMUNITY): Payer: Self-pay

## 2024-05-31 ENCOUNTER — Other Ambulatory Visit (HOSPITAL_COMMUNITY): Payer: Self-pay

## 2024-05-31 ENCOUNTER — Other Ambulatory Visit: Payer: Self-pay

## 2024-08-09 ENCOUNTER — Encounter: Admitting: Family Medicine
